# Patient Record
Sex: Female | Born: 1945 | Race: White | Hispanic: No | Marital: Married | Smoking: Never smoker
Health system: Southern US, Community
[De-identification: ages and names within clinical notes are randomized; demographics above are authoritative.]

## PROBLEM LIST (undated history)

## (undated) DIAGNOSIS — I10 Essential (primary) hypertension: Secondary | ICD-10-CM

## (undated) DIAGNOSIS — M2010 Hallux valgus (acquired), unspecified foot: Secondary | ICD-10-CM

## (undated) DIAGNOSIS — M2040 Other hammer toe(s) (acquired), unspecified foot: Secondary | ICD-10-CM

## (undated) DIAGNOSIS — L603 Nail dystrophy: Secondary | ICD-10-CM

## (undated) DIAGNOSIS — M1990 Unspecified osteoarthritis, unspecified site: Secondary | ICD-10-CM

## (undated) HISTORY — DX: Other hammer toe(s) (acquired), unspecified foot: M20.40

## (undated) HISTORY — DX: Hallux valgus (acquired), unspecified foot: M20.10

## (undated) HISTORY — DX: Essential (primary) hypertension: I10

## (undated) HISTORY — DX: Unspecified osteoarthritis, unspecified site: M19.90

## (undated) HISTORY — PX: COLONOSCOPY: SHX174

## (undated) HISTORY — DX: Nail dystrophy: L60.3

---

## 2005-01-17 ENCOUNTER — Encounter: Payer: Self-pay | Admitting: Internal Medicine

## 2008-09-08 HISTORY — PX: TOTAL HIP ARTHROPLASTY: SHX124

## 2009-02-26 ENCOUNTER — Inpatient Hospital Stay (HOSPITAL_COMMUNITY): Admission: RE | Admit: 2009-02-26 | Discharge: 2009-02-28 | Payer: Self-pay | Admitting: Orthopedic Surgery

## 2010-08-21 ENCOUNTER — Encounter (INDEPENDENT_AMBULATORY_CARE_PROVIDER_SITE_OTHER): Payer: Self-pay | Admitting: *Deleted

## 2010-09-08 HISTORY — PX: BUNIONECTOMY: SHX129

## 2010-09-17 ENCOUNTER — Encounter (INDEPENDENT_AMBULATORY_CARE_PROVIDER_SITE_OTHER): Payer: Self-pay | Admitting: *Deleted

## 2010-09-18 ENCOUNTER — Ambulatory Visit
Admission: RE | Admit: 2010-09-18 | Discharge: 2010-09-18 | Payer: Self-pay | Source: Home / Self Care | Attending: Internal Medicine | Admitting: Internal Medicine

## 2010-10-08 ENCOUNTER — Encounter: Payer: Self-pay | Admitting: Internal Medicine

## 2010-10-08 ENCOUNTER — Ambulatory Visit
Admission: RE | Admit: 2010-10-08 | Discharge: 2010-10-08 | Payer: Self-pay | Source: Home / Self Care | Attending: Internal Medicine | Admitting: Internal Medicine

## 2010-10-10 NOTE — Miscellaneous (Signed)
Summary: [facility] Previsit/prep  Clinical Lists Changes  Medications: Added new medication of MOVIPREP 100 GM  SOLR (PEG-KCL-NACL-NASULF-NA ASC-C) As per prep instructions. - Signed Rx of MOVIPREP 100 GM  SOLR (PEG-KCL-NACL-NASULF-NA ASC-C) As per prep instructions.;  #1 x 0;  Signed;  Entered by: Wyona Almas RN;  Authorized by: Iva Boop MD, [facility];  Method used: Electronically to [employer]. [place]. [phone]*, [address], [place], [place], [place]  [address], Ph: [phone] or [phone], Fax: [phone] Observations: Added new observation of NKA: T (09/18/2010 7:59)    Prescriptions: MOVIPREP 100 GM  SOLR (PEG-KCL-NACL-NASULF-NA ASC-C) As per prep instructions.  #1 x 0   Entered by:   Wyona Almas RN   Authorized by:   Iva Boop MD, [facility]   Signed by:   Wyona Almas RN on 09/18/2010   Method used:   Electronically to        [employer]. [address] [phone]* (retail)       [address]       [place], [place]  [address]       Ph: [phone] or [phone]       Fax: [phone]   RxID:   [id number]

## 2010-10-10 NOTE — Letter (Signed)
Summary: [facility]  [facility]   Imported By: Lester [place] 09/23/2010 08:50:11  _____________________________________________________________________  External Attachment:    Type:   Image     Comment:   External Document

## 2010-10-10 NOTE — Letter (Signed)
Summary: Pre Visit Letter Revised  [facility]  [address] [place], [place] [address]   Phone: [phone]  Fax: [phone]        08/21/2010 MRN: [id number] [facility] [address] [place], [place]  [address]             Procedure Date:  10/08/2010  Welcome to the Gastroenterology Division at [facility].    You are scheduled to see a nurse for your pre-procedure visit on 09/18/2010 at 8:00AM on the 3rd floor at [facility], [address]  We ask that you try to arrive at our office 15 minutes prior to your appointment time to allow for check-in.  Please take a minute to review the attached form.  If you answer "Yes" to one or more of the questions on the first page, we ask that you call the person listed at your earliest opportunity.  If you answer "No" to all of the questions, please complete the rest of the form and bring it to your appointment.    Your nurse visit will consist of discussing your medical and surgical history, your immediate family medical history, and your medications.   If you are unable to list all of your medications on the form, please bring the medication bottles to your appointment and we will list them.  We will need to be aware of both prescribed and over the counter drugs.  We will need to know exact dosage information as well.    Please be prepared to read and sign documents such as consent forms, a financial agreement, and acknowledgement forms.  If necessary, and with your consent, a friend or relative is welcome to sit-in on the nurse visit with you.  Please bring your insurance card so that we may make a copy of it.  If your insurance requires a referral to see a specialist, please bring your referral form from your primary care physician.  No co-pay is required for this nurse visit.     If you cannot keep your appointment, please call [phone] to cancel or reschedule prior to your appointment date.  This  allows [place] the opportunity to schedule an appointment for another patient in need of care.    Thank you for choosing [facility] for your medical needs.  We appreciate the opportunity to care for you.  Please visit [place] at our website  to learn more about our practice.  Sincerely, The Gastroenterology Division

## 2010-10-10 NOTE — Letter (Signed)
Summary: [facility] Instructions  [facility]  [address] [place], [place] [address]   Phone: [phone]  Fax: [phone]       Elizabeth Hale    [birth year]    MRN: [id number]        Procedure Day /Date:  Tuesday 10-08-10     Arrival Time:  9:00 am      Procedure Time:  10:00 am     Location of Procedure:                    _X_  [facility] (4th Floor)                        PREPARATION FOR COLONOSCOPY WITH MOVIPREP   Starting 5 days prior to your procedure _Thursday 1/26/12_ do not eat nuts, seeds, popcorn, corn, beans, peas,  salads, or any raw vegetables.  Do not take any fiber supplements (e.g. Metamucil, Citrucel, and Benefiber).  THE DAY BEFORE YOUR PROCEDURE         DATE: _1/30/12_  DAY: _Monday  _  1.  Drink clear liquids the entire day-NO SOLID FOOD  2.  Do not drink anything colored red or purple.  Avoid juices with pulp.  No orange juice.  3.  Drink at least 64 oz. (8 glasses) of fluid/clear liquids during the day to prevent dehydration and help the prep work efficiently.  CLEAR LIQUIDS INCLUDE: Water Jello Ice Popsicles Tea (sugar ok, no milk/cream) Powdered fruit flavored drinks Coffee (sugar ok, no milk/cream) Gatorade Juice: apple, white grape, white cranberry  Lemonade Clear bullion, consomm, broth Carbonated beverages (any kind) Strained chicken noodle soup Hard Candy                             4.  In the morning, mix first dose of MoviPrep solution:    Empty 1 Pouch A and 1 Pouch B into the disposable container    Add lukewarm drinking water to the top line of the container. Mix to dissolve    Refrigerate (mixed solution should be used within 24 hrs)  5.  Begin drinking the prep at 5:00 p.m. The MoviPrep container is divided by 4 marks.   Every 15 minutes drink the solution down to the next mark (approximately 8 oz) until the full liter is complete.   6.  Follow completed prep with 16 oz of clear liquid of  your choice (Nothing red or purple).  Continue to drink clear liquids until bedtime.  7.  Before going to bed, mix second dose of MoviPrep solution:    Empty 1 Pouch A and 1 Pouch B into the disposable container    Add lukewarm drinking water to the top line of the container. Mix to dissolve    Refrigerate  THE DAY OF YOUR PROCEDURE      DATE: _1/31/12  _ DAY: _Tuesday  _  Beginning at _5:00_a.m. (5 hours before procedure):         1. Every 15 minutes, drink the solution down to the next mark (approx 8 oz) until the full liter is complete.  2. Follow completed prep with 16 oz. of clear liquid of your choice.    3. You may drink clear liquids until _8:00 am_ (2 HOURS BEFORE PROCEDURE).   MEDICATION INSTRUCTIONS  Unless otherwise instructed, you should take regular prescription medications with a small sip of water  as early as possible the morning of your procedure.    Additional medication instructions: Hold HCTZ the morning of procedure.         OTHER INSTRUCTIONS  You will need a responsible adult at least 64 years of age to accompany you and drive you home.   This person must remain in the waiting room during your procedure.  Wear loose fitting clothing that is easily removed.  Leave jewelry and other valuables at home.  However, you may wish to bring a book to read or  an iPod/MP3 player to listen to music as you wait for your procedure to start.  Remove all body piercing jewelry and leave at home.  Total time from sign-in until discharge is approximately 2-3 hours.  You should go home directly after your procedure and rest.  You can resume normal activities the  day after your procedure.  The day of your procedure you should not:   Drive   Make legal decisions   Operate machinery   Drink alcohol   Return to work  You will receive specific instructions about eating, activities and medications before you leave.    The above instructions have been  reviewed and explained to me by   Wyona Almas RN  September 18, 2010 8:20 AM     I fully understand and can verbalize these instructions _____________________________ Date _________

## 2010-10-16 NOTE — Procedures (Signed)
Summary: Colonoscopy  Patient: [name] Note: All result statuses are Final unless otherwise noted.  Tests: (1) Colonoscopy (COL)   COL Colonoscopy           DONE     [facility]     [address]     [place], [place]  [address]           COLONOSCOPY PROCEDURE REPORT           PATIENT:  Elizabeth, Hale  MR#:  [id number]     BIRTHDATE:  1946/09/06, 64 yrs. old  GENDER:  female     ENDOSCOPIST:  Iva Boop, MD, [facility]     REF. BY:  Guerry Bruin, M.D.     PROCEDURE DATE:  10/08/2010     PROCEDURE:  Colonoscopy 19147     ASA CLASS:  Class II     INDICATIONS:  Routine Risk Screening     MEDICATIONS:   Fentanyl 50 mcg, Versed 5 mg           DESCRIPTION OF PROCEDURE:   After the risks benefits and     alternatives of the procedure were thoroughly explained, informed     consent was obtained.  Digital rectal exam was performed and     revealed no abnormalities.   The LB PCF-H180AL [id number] endoscope     was introduced through the anus and advanced to the cecum, which     was identified by both the appendix and ileocecal valve, without     limitations.  The quality of the prep was excellent, using     MoviPrep.  The instrument was then slowly withdrawn as the colon     was fully examined. Insertion: 6:54 minutes Withdrawal: 11:50     minutes     <<PROCEDUREIMAGES>>           FINDINGS:  A normal appearing cecum, ileocecal valve, and     appendiceal orifice were identified. The ascending, hepatic     flexure, transverse, splenic flexure, descending, sigmoid colon,     and rectum appeared unremarkable.   Retroflexed views in the right     colon and  rectum revealed no abnormalities.    The scope was then     withdrawn from the patient and the procedure completed.           COMPLICATIONS:  None     ENDOSCOPIC IMPRESSION:     1) Normal colonoscopy, good prep           REPEAT EXAM:  In 10 year(s) for routine screening colonoscopy.     Note: her mother had colon cancer  but it was diagnosed at age 73 or     38 so the patient is not at increased risk of colon cancer per     today's guidelines (due to advanced age of cancer diagnosis)     Iva Boop, MD, [facility]           CC:  Guerry Bruin, MD     The Patient           n.     Rosalie Doctor:   Iva Boop at 10/08/2010 11:08 AM           Malva Limes, [phone]  Note: An exclamation mark (!) indicates a result that was not dispersed into the flowsheet. Document Creation Date: 10/08/2010 11:09 AM _______________________________________________________________________  (1) Order result status: Final Collection or observation date-time: 10/08/2010 11:00 Requested date-time:  Receipt date-time:  Reported date-time:  Referring Physician:   Ordering Physician: Stan Head [phone]) Specimen Source:  Source: Launa Grill Order Number: [id number] Lab site:

## 2010-10-22 ENCOUNTER — Encounter (HOSPITAL_BASED_OUTPATIENT_CLINIC_OR_DEPARTMENT_OTHER)
Admission: RE | Admit: 2010-10-22 | Discharge: 2010-10-22 | Disposition: A | Discharge status: Home / Self Care | Payer: BC Managed Care – PPO | Source: Ambulatory Visit | Attending: Orthopedic Surgery | Admitting: Orthopedic Surgery

## 2010-10-22 DIAGNOSIS — Z01810 Encounter for preprocedural cardiovascular examination: Secondary | ICD-10-CM | POA: Insufficient documentation

## 2010-10-22 DIAGNOSIS — Z01812 Encounter for preprocedural laboratory examination: Secondary | ICD-10-CM | POA: Insufficient documentation

## 2010-10-22 LAB — BASIC METABOLIC PANEL
BUN: 16 mg/dL (ref 6–23)
Chloride: 102 mEq/L (ref 96–112)
Creatinine, Ser: 0.72 mg/dL (ref 0.4–1.2)
Glucose, Bld: 108 mg/dL — ABNORMAL HIGH (ref 70–99)
Potassium: 4.8 mEq/L (ref 3.5–5.1)

## 2010-10-23 ENCOUNTER — Ambulatory Visit (HOSPITAL_BASED_OUTPATIENT_CLINIC_OR_DEPARTMENT_OTHER)
Admission: RE | Admit: 2010-10-23 | Discharge: 2010-10-23 | Disposition: A | Discharge status: Home / Self Care | Payer: BC Managed Care – PPO | Attending: Orthopedic Surgery | Admitting: Orthopedic Surgery

## 2010-10-23 DIAGNOSIS — Z01812 Encounter for preprocedural laboratory examination: Secondary | ICD-10-CM | POA: Insufficient documentation

## 2010-10-23 DIAGNOSIS — G5600 Carpal tunnel syndrome, unspecified upper limb: Secondary | ICD-10-CM | POA: Insufficient documentation

## 2010-10-28 NOTE — Op Note (Signed)
  NAMELAURALYE, KINN                ACCOUNT NO.:  [id number]  MEDICAL RECORD NO.:  [id number]           PATIENT TYPE:  LOCATION:                                 FACILITY:  PHYSICIAN:  Cindee Salt, M.D.       DATE OF BIRTH:  01-Sep-1946  DATE OF PROCEDURE:  10/23/2010 DATE OF DISCHARGE:                              OPERATIVE REPORT   PREOPERATIVE DIAGNOSIS:  Carpal tunnel syndrome, right hand.  POSTOPERATIVE DIAGNOSIS:  Carpal tunnel syndrome, right hand.  OPERATION:  Decompression of right median nerve.  SURGEON:  Cindee Salt, MD  ANESTHESIA:  Forearm-based IV regional with local infiltration.  ANESTHESIOLOGIST:  Janetta Hora. Gelene Mink, MD.  HISTORY:  The patient is a 65 year old female with a history of carpal tunnel syndrome, EMG nerve conductions positive.  This has not responded to conservative treatment.  She has elected to undergo surgical decompression.  Pre, peri, and postoperative course have been discussed along with risks and complications.  She is aware that there is no guarantee with the surgery, possibility of infection, recurrence, injury to arteries, nerves, tendons, incomplete relief of symptoms, dystrophy. In preoperative area, the patient was seen, the extremity was marked by both the patient and surgeon, antibiotic given.  PROCEDURE:  The patient was brought to the operating room where a forearm based IV regional anesthetic was carried out without difficulty. She was prepped using ChloraPrep in supine position, the right arm free. A 3-minute dry time was allowed.  Time-out taken confirming the patient and procedure.  A longitudinal incision was made in the palm, carried down through subcutaneous tissue.  Bleeders were electrocauterized, palmar fascia was split, superficial palmar arch identified, the flexor tendon to the ring and little fingers identified to the ulnar side of median nerve.  The carpal retinaculum was incised with sharp dissection. Right  angle and Sewall retractors were placed between skin and forearm fascia.  The fascia released for approximately a centimeter and half proximal to the wrist crease under direct vision.  The canal was explored.  The area of compression to the nerve was apparent.  No further lesions were identified.  Tenosynovium tissue was moderately thickened.  The wound was copiously irrigated with saline.  The skin was closed with interrupted 5-0 Vicryl Rapide sutures.  A local infiltration with 0.25% Marcaine without epinephrine was given, approximately 4.5 mL was used.  A sterile compressive dressing and splint to the wrist with fingers free was applied.  On deflation of the tourniquet, all fingers immediately pinked.  She was taken to the recovery room for observation in satisfactory condition.  She will be discharged home to return to the [facility] in 1 week on Vicodin.          ______________________________ Cindee Salt, M.D.     GK/MEDQ  D:  10/23/2010  T:  10/23/2010  Job:  [id number]  cc:   Gaspar Garbe, M.D.  Electronically Signed by Cindee Salt M.D. on 10/28/2010 02:26:20 PM

## 2010-12-16 LAB — BASIC METABOLIC PANEL
BUN: 19 mg/dL (ref 6–23)
BUN: 6 mg/dL (ref 6–23)
CO2: 28 mEq/L (ref 19–32)
Calcium: 8.3 mg/dL — ABNORMAL LOW (ref 8.4–10.5)
Calcium: 8.5 mg/dL (ref 8.4–10.5)
Chloride: 101 mEq/L (ref 96–112)
Chloride: 103 mEq/L (ref 96–112)
Creatinine, Ser: 0.7 mg/dL (ref 0.4–1.2)
Creatinine, Ser: 0.75 mg/dL (ref 0.4–1.2)
Creatinine, Ser: 0.97 mg/dL (ref 0.4–1.2)
GFR calc Af Amer: >60 mL/min (ref >60)
GFR calc non Af Amer: 58 mL/min — ABNORMAL LOW (ref >60)
GFR calc non Af Amer: >60 mL/min (ref >60)
Glucose, Bld: 113 mg/dL — ABNORMAL HIGH (ref 70–99)
Glucose, Bld: 96 mg/dL (ref 70–99)
Potassium: 3.3 mEq/L — ABNORMAL LOW (ref 3.5–5.1)
Potassium: 3.9 mEq/L (ref 3.5–5.1)
Sodium: 137 mEq/L (ref 135–145)

## 2010-12-16 LAB — DIFFERENTIAL
Basophils Relative: 1 % (ref 0–1)
Eosinophils Absolute: 0.1 10*3/uL (ref 0.0–0.7)
Eosinophils Relative: 2 % (ref 0–5)
Lymphs Abs: 1.4 10*3/uL (ref 0.7–4.0)
Neutrophils Relative %: 64 % (ref 43–77)

## 2010-12-16 LAB — CBC
HCT: 32.2 % — ABNORMAL LOW (ref 36.0–46.0)
HCT: 41.3 % (ref 36.0–46.0)
Hemoglobin: 11.1 g/dL — ABNORMAL LOW (ref 12.0–15.0)
MCHC: 34.2 g/dL (ref 30.0–36.0)
MCHC: 34.3 g/dL (ref 30.0–36.0)
MCV: 92.8 fL (ref 78.0–100.0)
MCV: 93.7 fL (ref 78.0–100.0)
Platelets: 160 10*3/uL (ref 150–400)
Platelets: 164 10*3/uL (ref 150–400)
Platelets: 209 10*3/uL (ref 150–400)
RDW: 13.1 % (ref 11.5–15.5)
RDW: 13.4 % (ref 11.5–15.5)
RDW: 13.6 % (ref 11.5–15.5)
WBC: 8.3 10*3/uL (ref 4.0–10.5)

## 2010-12-16 LAB — URINALYSIS, ROUTINE W REFLEX MICROSCOPIC
Bilirubin Urine: NEGATIVE
Glucose, UA: NEGATIVE mg/dL
Hgb urine dipstick: NEGATIVE
Protein, ur: NEGATIVE mg/dL

## 2010-12-16 LAB — TYPE AND SCREEN
ABO/RH(D): A POS
Antibody Screen: NEGATIVE

## 2010-12-16 LAB — PROTIME-INR: Prothrombin Time: 13.5 seconds (ref 11.6–15.2)

## 2010-12-16 LAB — ABO/RH: ABO/RH(D): A POS

## 2011-01-21 NOTE — H&P (Signed)
NAMELADA, FULBRIGHT                ACCOUNT NO.:  [id number]   MEDICAL RECORD NO.:  [id number]          PATIENT TYPE:  INP   LOCATION:  NA                           FACILITY[facility]  [facility]   PHYSICIAN:  Madlyn Frankel. Charlann Boxer, M.D.  DATE OF BIRTH:  1946/07/14   DATE OF ADMISSION:  DATE OF DISCHARGE:                              HISTORY & PHYSICAL   PRIMARY CARE PHYSICIAN:  Gaspar Garbe, M.D.   ADMISSION DIAGNOSIS:  Right hip osteoarthritis.   SECONDARY DIAGNOSIS:  Borderline hypertension.   BRIEF HISTORY:  Elizabeth Hale is a very pleasant 65 year old female who  presented to the office for evaluation of right hip pain recently.  Radiographs revealed advanced right hip osteoarthritis.  She had failed  conservative measures and already wished to proceed with the hip  replacement surgery after reviewing the risks and benefits.  She is here  for history and physical.   PAST MEDICAL HISTORY:  Borderline hypertension.   PAST SURGICAL HISTORY:  Laparoscopy surgery as well as D&C in the past.   SOCIAL HISTORY:  She denies smoking and alcohol.  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  Her primary care physician Dr. Guerry Bruin,  She  enjoys some wine occasionally.   CURRENT MEDICATIONS:  Include hydrochlorothiazide and Relafen without  significant relief of her hip pain.   DRUG ALLERGIES:  No known drug allergies.   REVIEW OF SYSTEMS:  Otherwise unremarkable for any upper respiratory,  pulmonary, gastrointestinal or genitourinary issues over the past 2 to 3  weeks.   EXAMINATION:  GENERAL:  She is awake, alert and oriented, in no acute  distress.  She is 5 feet 7 inches, 170 pounds.  VITAL SIGNS:  At the time of evaluation, she had a pulse of 56,  respirations 18 and blood pressure 122/78.  HEAD AND NECK:  Exam is within normal limits without any ocular  dysfunction or slurred speech.  CHEST:  Exam reveals that she is clear to auscultation.  HEART:  Regular rate and rhythm.  ABDOMEN: Soft  and nontender.  EXAMINATION of EXTREMITIES:  Revealed a Trendelenburg gait of the right  hip with limited hip range of motion on the right with normal hip range  of motion on the left.  NEUROLOGIC:  She is neurovascularly stable distally without significant  leg length discrepancy.   Labs and EKG will be pending her evaluation through the [facility]  system.  She has been seen and evaluated by Dr. Wylene Simmer and  cleared for  surgery.   Radiographs of her hip were reviewed with her indicating advanced right  hip osteoarthritis with relative preservation of the left hip.   ASSESSMENT:  Advanced right hip osteoarthritis.   PLAN:  She is to be admitted for same-day surgery on February 26, 2009 for  her right total hip replacement.  Risks and benefits of the procedure  were discussed at her primary evaluation and reviewed again today.   She will hopefully plan to return to home postoperatively with the use  of home physical therapy progressing to independent activity over the  next 3 to 4 weeks.  Questions encouraged, answered, and reviewed.  Consent will be obtained based upon our discussion today.      Madlyn Frankel Charlann Boxer, M.D.  Electronically Signed     MDO/MEDQ  D:  02/18/2009  T:  02/18/2009  Job:  [id number]

## 2011-01-21 NOTE — Op Note (Signed)
Elizabeth Hale, Elizabeth Hale                ACCOUNT NO.:  [id number]   MEDICAL RECORD NO.:  [id number]          PATIENT TYPE:  INP   LOCATION:  0004                         FACILITY:  [facility]   PHYSICIAN:  Madlyn Frankel. Charlann Boxer, M.D.  DATE OF BIRTH:  09/24/1945   DATE OF PROCEDURE:  02/26/2009  DATE OF DISCHARGE:                               OPERATIVE REPORT   PREOPERATIVE DIAGNOSIS:  Right hip osteoarthritis.   POSTOPERATIVE DIAGNOSIS:  Right hip osteoarthritis.   PROCEDURE:  Right total hip replacement.   COMPONENTS USED:  DePuy hip system size 52 Pinnacle cup, a 36 metal-on-  metal liner with a 5 high Trilock stem and a 36, +5 ball utilized.   SURGEON:  Madlyn Frankel. Charlann Boxer, M.D.   ASSISTANT:  Yetta Glassman. Mann, PA.   ANESTHESIA:  Spinal block anesthesia.   FINDINGS:  None.   SPECIMENS:  None.   COMPLICATIONS:  None.   DRAINS:  Times one.   ESTIMATED BLOOD LOSS:  300 mL.   INDICATIONS FOR PROCEDURE:  Ms. Gassner is a very pleasant 65 year old  female.  She presented to the office for evaluation of right hip pain.  Radiographs revealed bone-on-bone arthritis.  There was very little  conservative measures that would provide any longterm relief and I  reviewed this with her.  The hip replacement surgery was discussed in  terms of the risks and benefits and the pros and cons.  Consent was  obtained for the benefit of pain relief.   PROCEDURE IN DETAIL:  The patient was brought to the operative theater.  Once adequate anesthesia, preoperative antibiotics, Ancef administered  the patient was positioned in the left lateral decubitus position with  right-side up.  We then pre-scrubbed, prepped and draped the right hip.  A time-out was performed identifying the patient, planned procedure and  the extremity.   A lateral based incision was made for a posterior approach to the hip.  The iliotibial band and gluteal fascia were then dissected out and then  incised posteriorly.  The sharp dissection  was carried down to the short  external rotators.  These were taken down and separated from the  posterior capsule.  An L capsulotomy was made preserving the posterior  leaflet for protection and the sciatic nerve, but also repaired and the  case.   The hip was dislocated and significant pathology identified.  I made a  neck osteotomy utilizing a broach.  The final set broach with 28 ball in  the center head for landmark identification.  Following the neck  osteotomy, attention was directed to the femoral preparation.  I used a  box osteotome to assure that I was out lateral within the neck.  We used  a starting drill and hand reamed the acetabulum and femoral canal once  and then irrigated to prevent fat emboli.  I began broaching with a size  1 broach setting within her native femur which was about 20-25 degrees.  I broached all way up to a size 5 which had excellent medial and lateral  fit with the proximal femur.   Attention was  now directed to the acetabulum.  The Acetabular retractors  were placed.  I debrided the labrum and began reaming with a 45 reamer.  I then went to 47 and 49 and 51 reamers with good bony bed preparation.  The canal was fine.  The acetabulum was finally prepared with irrigation  and a size 52 Pinnacle cup was impacted.   Due to the size of her thigh I ended up using a bone tamp on the  anterior aspect of the cup in order to assure that I had enough  anteversion.  There was a portion the cup exposed in the superior  lateral.  The final impaction was carried out and it was well seated  within the acetabular bed.  I placed the two cancellus bone screws  within the ilium and a trial liner.   The trial reduction was now carried out with a 5 broach in place, a 5  high neck and a 36,  +1.5 ball.   The combined anteversion seemed to be about 45-50 degrees.  There was 7  mm of shuck in the leg lengths just a little shorter than the  preoperative position.  I  went ahead and trialed a 36+ ball.  The  approximation was excellent without evidence of impingement or  subluxation.  The leg lengths appeared to be better with only a mm or  less of shuck.   Given these parameters, the trial components were removed.  I placed the  final 36 metal liner and impacted it with good visualized rim fit.   The final 5 high Trilock stem was then opened and impacted at the level  of the broach.  I did go ahead and retrial just to make sure that I was  happy with the leg lengths and stability and once I had confirmed this a  36, +5 ball was impacted onto clean and dry trunnion.  The hip was  reduced.  We irrigated the hip throughout the case and again at this  point.  I reapproximated the posterior capsule and superior leaflet  using #1 Vicryl.  A medium Hemovac drain was placed deep.  The remainder  of the wound was closed in layers with #1 Vicryl in the iliotibial band  and the gluteal fascia.  The remainder of the wound was closed with 2-0  Vicryl and running 4-0 Monocryl.  The hip was cleaned, dried and dressed  sterilely with Steri-Strips and a Mepilex dressing.  She was then  brought to the recovery room in stable condition, tolerating the  procedure well.      Madlyn Frankel Charlann Boxer, M.D.  Electronically Signed     MDO/MEDQ  D:  02/26/2009  T:  02/26/2009  Job:  [id number]

## 2011-01-24 NOTE — Discharge Summary (Signed)
Elizabeth Hale, Elizabeth Hale                ACCOUNT NO.:  [id number]   MEDICAL RECORD NO.:  [id number]          PATIENT TYPE:  INP   LOCATION:  1616                         FACILITY:  [facility]   PHYSICIAN:  Madlyn Frankel. Charlann Boxer, M.D.  DATE OF BIRTH:  1946/05/30   DATE OF ADMISSION:  02/26/2009  DATE OF DISCHARGE:  02/28/2009                               DISCHARGE SUMMARY   ADMISSION DIAGNOSES:  1. Osteoarthritis.  2. Borderline hypertension.   DISCHARGE DIAGNOSES:  1. Osteoarthritis.  2. Borderline hypertension.   HISTORY OF PRESENT ILLNESS:  Elizabeth Hale is a very pleasant 65 year old  female with history of right hip pain secondary to osteoarthritis.  She  has failed all conservative measures.   CONSULTATION:  None.   PROCEDURE:  Right total hip replacement by surgeon Dr. Durene Romans,  assistant Dwyane Luo PA-C.   LABORATORY DATA:  CBC final reading showed a white blood cell 8.3,  hemoglobin 10.3, hematocrit 29.8, platelets 164.  Metabolic:  Sodium  137, potassium 3.3, BUN 6, creatinine 0.75, glucose 113.  Calcium 8.5.  UA was negative.   HOSPITAL COURSE:  The patient underwent a right total hip replacement  and was admitted to orthopedic floor.  Her stay was unremarkable.  She  remained hemodynamically orthopedically stable.  Dressing was changed on  day two, no significant drainage from wound.  She remained  neurovascularly intact in the lower extremity.  Throughout her stay she  had improving quad function and ability to ambulate prior to discharge.  Seen on day 2, stable and ready for discharge home.   DISCHARGE DISPOSITION:  Discharged home in stable and improved condition  with home health care PT.   DISCHARGE DIET:  Heart-healthy.   DISCHARGE WOUND CARE:  Keep dry.   DISCHARGE MEDICATIONS:  1. Aspirin 325 mg 1 p.o. b.i.d. times 6 weeks.  2. Robaxin 500 mg 1 p.o. q. 6.  3. Iron 325 mg 1 p.o. t.i.d.  4. Colace 100 mg p.o. b.i.d.  5. MiraLax 17 grams p.o. daily.  6. Norco  7.5/325 one to two p.o. every 4 to 6 hours p.r.n. pain.  7. Zofran ODT 4 mg sublingual q. 6 p.r.n. nausea.  8. HCTZ 25 mg 1  p.o. daily.]  9. Clindamycin topical b.i.d.   DISCHARGE FOLLOWUP:  Follow with Dr. Charlann Boxer at phone number [phone] in 2  weeks for wound check.     ______________________________  Yetta Glassman. [address], [place]      Madlyn Frankel. Charlann Boxer, M.D.  Electronically Signed    BLM/MEDQ  D:  03/13/2009  T:  03/13/2009  Job:  [id number]   cc:   Gaspar Garbe, M.D.  Fax: [phone]

## 2014-06-16 ENCOUNTER — Encounter (HOSPITAL_BASED_OUTPATIENT_CLINIC_OR_DEPARTMENT_OTHER): Payer: Self-pay

## 2014-06-16 ENCOUNTER — Ambulatory Visit (HOSPITAL_BASED_OUTPATIENT_CLINIC_OR_DEPARTMENT_OTHER): Admit: 2014-06-16 | Payer: BC Managed Care – PPO | Admitting: Podiatry

## 2014-06-16 SURGERY — OSTEOTOMY, METATARSAL BONE
Anesthesia: General | Site: Toe | Laterality: Right

## 2014-10-24 ENCOUNTER — Ambulatory Visit: Payer: Self-pay | Admitting: Podiatry

## 2014-10-31 ENCOUNTER — Encounter: Payer: Self-pay | Admitting: Podiatry

## 2014-10-31 ENCOUNTER — Ambulatory Visit (INDEPENDENT_AMBULATORY_CARE_PROVIDER_SITE_OTHER): Payer: BC Managed Care – PPO | Admitting: Podiatry

## 2014-10-31 ENCOUNTER — Ambulatory Visit (INDEPENDENT_AMBULATORY_CARE_PROVIDER_SITE_OTHER): Payer: BC Managed Care – PPO

## 2014-10-31 VITALS — BP 126/67 | HR 72 | Resp 16 | Ht 67.0 in | Wt 178.0 lb

## 2014-10-31 DIAGNOSIS — M2011 Hallux valgus (acquired), right foot: Secondary | ICD-10-CM

## 2014-10-31 DIAGNOSIS — L608 Other nail disorders: Secondary | ICD-10-CM

## 2014-10-31 DIAGNOSIS — L603 Nail dystrophy: Secondary | ICD-10-CM

## 2014-10-31 DIAGNOSIS — M205X1 Other deformities of toe(s) (acquired), right foot: Secondary | ICD-10-CM | POA: Diagnosis not present

## 2014-10-31 DIAGNOSIS — M2041 Other hammer toe(s) (acquired), right foot: Secondary | ICD-10-CM

## 2014-10-31 DIAGNOSIS — M1990 Unspecified osteoarthritis, unspecified site: Secondary | ICD-10-CM

## 2014-10-31 NOTE — Patient Instructions (Addendum)
Pre-Operative Instructions  Congratulations, you have decided to take an important step to improving your quality of life.  You can be assured that the doctors of [facility] will be with you every step of the way.  1. Plan to be at the surgery center/hospital at least 1 (one) hour prior to your scheduled time unless otherwise directed by the surgical center/hospital staff.  You must have a responsible adult accompany you, remain during the surgery and drive you home.  Make sure you have directions to the surgical center/hospital and know how to get there on time. 2. For hospital based surgery you will need to obtain a history and physical form from your family physician within 1 month prior to the date of surgery- we will give you a form for you primary physician.  3. We make every effort to accommodate the date you request for surgery.  There are however, times where surgery dates or times have to be moved.  We will contact you as soon as possible if a change in schedule is required.   4. No Aspirin/Ibuprofen for one week before surgery.  If you are on aspirin, any non-steroidal anti-inflammatory medications (Mobic, Aleve, Ibuprofen) you should stop taking it 7 days prior to your surgery.  You make take Tylenol  For pain prior to surgery.  5. Medications- If you are taking daily heart and blood pressure medications, seizure, reflux, allergy, asthma, anxiety, pain or diabetes medications, make sure the surgery center/hospital is aware before the day of surgery so they may notify you which medications to take or avoid the day of surgery. 6. No food or drink after midnight the night before surgery unless directed otherwise by surgical center/hospital staff. 7. No alcoholic beverages 24 hours prior to surgery.  No smoking 24 hours prior to or 24 hours after surgery. 8. Wear loose pants or shorts- loose enough to fit over bandages, boots, and casts. 9. No slip on shoes, sneakers are best. 10. Bring  your boot with you to the surgery center/hospital.  Also bring crutches or a walker if your physician has prescribed it for you.  If you do not have this equipment, it will be provided for you after surgery. 11. If you have not been contracted by the surgery center/hospital by the day before your surgery, call to confirm the date and time of your surgery. 12. Leave-time from work may vary depending on the type of surgery you have.  Appropriate arrangements should be made prior to surgery with your employer. 13. Prescriptions will be provided immediately following surgery by your doctor.  Have these filled as soon as possible after surgery and take the medication as directed. 14. Remove nail polish on the operative foot. 15. Wash the night before surgery.  The night before surgery wash the foot and leg well with the antibacterial soap provided and water paying special attention to beneath the toenails and in between the toes.  Rinse thoroughly with water and dry well with a towel.  Perform this wash unless told not to do so by your physician.  Enclosed: 1 Ice pack (please put in freezer the night before surgery)   1 Hibiclens skin cleaner   Pre-op Instructions  If you have any questions regarding the instructions, do not hesitate to call our office.  [place]: [address] [place], [place] [address] [phone]  [place]: [address] [place], [place] [address] [phone]  [place]: [address]  [place], [place] [address] [phone]  Dr. Richard   Tuchman DPM, Dr. Ila Mcgill DPM Dr. Harriet Masson DPM, Dr. Lanelle Bal DPM, Dr. Trudie Buckler DPM   Bunion (Hallux Valgus) A bony bump (protrusion) on the inside of the foot, at the base of the first toe, is called a bunion (hallux valgus). A bunion causes the first toe to angle toward the other toes. SYMPTOMS  16. A bony bump on the inside of the foot, causing an outward turning of the first toe. It may also overlap the second  toe. 17. Thickening of the skin (callus) over the bony bump. 18. Fluid buildup under the callus. Fluid may become red, tender, and swollen (inflamed) with constant irritation or pressure. 19. Foot pain and stiffness. CAUSES  Many causes exist, including:  Inherited from your family (genetics).  Injury (trauma) forcing the first toe into a position in which it overlaps other toes.  Bunions are also associated with wearing shoes that have a narrow toe box (pointy shoes). RISK INCREASES WITH:  Family history of foot abnormalities, especially bunions.  Arthritis.  Narrow shoes, especially high heels. PREVENTION  Wear shoes with a wide toe box.  Avoid shoes with high heels.  Wear a small pad between the big toe and second toe.  Maintain proper conditioning:  Foot and ankle flexibility.  Muscle strength and endurance. PROGNOSIS  With proper treatment, bunions can typically be cured. Occasionally, surgery is required.  RELATED COMPLICATIONS   Infection of the bunion.  Arthritis of the first toe.  Risks of surgery, including infection, bleeding, injury to nerves (numb toe), recurrent bunion, overcorrection (toe points inward), arthritis of the big toe, big toe pointing upward, and bone not healing. TREATMENT  Treatment first consists of stopping the activities that aggravate the pain, taking pain medicines, and icing to reduce inflammation and pain. Wear shoes with a wide toe box. Shoes can be modified by a shoe repair person to relieve pressure on the bunion, especially if you cannot find shoes with a wide enough toe box. You may also place a pad with the center cut out in your shoe, to reduce pressure on the bunion. Sometimes, an arch support (orthotic) may reduce pressure on the bunion and alleviate the symptoms. Stretching and strengthening exercises for the muscles of the foot may be useful. You may choose to wear a brace or pad at night to hold the big toe away from the second  toe. If non-surgical treatments are not successful, surgery may be needed. Surgery involves removing the overgrown tissue and correcting the position of the first toe, by realigning the bones. Bunion surgery is typically performed on an outpatient basis, meaning you can go home the same day as surgery. The surgery may involve cutting the mid portion of the bone of the first toe, or just cutting and repairing (reconstructing) the ligaments and soft tissues around the first toe.  MEDICATION   If pain medicine is needed, nonsteroidal anti-inflammatory medicines, such as aspirin and ibuprofen, or other minor pain relievers, such as acetaminophen, are often recommended.  Do not take pain medicine for 7 days before surgery.  Prescription pain relievers are usually only prescribed after surgery. Use only as directed and only as much as you need.  Ointments applied to the skin may be helpful. HEAT AND COLD  Cold treatment (icing) relieves pain and reduces inflammation. Cold treatment should be applied for 10 to 15 minutes every 2 to 3 hours for inflammation and pain and immediately after any activity that aggravates your symptoms. Use ice  packs or an ice massage.  Heat treatment may be used prior to performing the stretching and strengthening activities prescribed by your caregiver, physical therapist, or athletic trainer. Use a heat pack or a warm soak. SEEK MEDICAL CARE IF:   Symptoms get worse or do not improve in 2 weeks, despite treatment.  After surgery, you develop fever, increasing pain, redness, swelling, drainage of fluids, bleeding, or increasing warmth around the surgical area.  New, unexplained symptoms develop. (Drugs used in treatment may produce side effects.) Document Released: 08/25/2005 Document Revised: 11/17/2011 Document Reviewed: 12/07/2008 [facility] Patient Information 2015 [place], [place]. This information is not intended to replace advice given to you by your health care  provider. Make sure you discuss any questions you have with your health care provider.    Hammer Toes Hammer toes is a condition in which one or more of your toes is permanently flexed. CAUSES  This happens when a muscle imbalance or abnormal bone length makes your small toes buckle. This causes the toe joint to contract and the strong cord-like bands that attach muscles to the bones (tendons) in your toes to shorten.  SIGNS AND SYMPTOMS  Common symptoms of flexible hammer toes include:  20. A buildup of skin cells (corns). Corns occur where boney bumps come in frequent contact with hard surfaces. For example, where your shoes press and rub. 21. Irritation. 22. Inflammation. 23. Pain. 24. Limited motion in your toes. DIAGNOSIS  Hammer toes are diagnosed through a physical exam of your toes. During the exam, your health care provider may try to reproduce your symptoms by manipulating your foot. Often, X-ray exams are done to determine the degree of deformity and to make sure that the cause is not a fracture.  TREATMENT  Hammer toes can be treated with corrective surgery. There are several types of surgical procedures that can treat hammer toes. The most common procedures include:  Arthroplasty--A portion of the joint is surgically removed and your toe is straightened. The gap fills in with fibrous tissue. This procedure helps treat pain and deformity and helps restore function.  Fusion--Cartilage between the two bones of the affected joint is taken out and the bones fuse together into one longer bone. This helps keep your toe stable and reduces pain but leaves your toe stiff, yet straight.  Implantation--A portion of your bone is removed and replaced with an implant to restore motion.  Flexor tendon transfers--This procedure repositions the tendons that curl the toes down (flexor tendons). This may be done to release the deforming force that causes your toe to buckle. Several of these  procedures require fixing your toe with a pin that is visible at the tip of your toe. The pin keeps the toe straight during healing. Your health care provider will remove the pin usually within 4-8 weeks after the procedure.  Document Released: 08/22/2000 Document Revised: 08/30/2013 Document Reviewed: 05/02/2013 [facility] Patient Information 2015 [place], [place]. This information is not intended to replace advice given to you by your health care provider. Make sure you discuss any questions you have with your health care provider.

## 2014-10-31 NOTE — Progress Notes (Signed)
   Subjective:    Patient ID: Elizabeth Hale, female    DOB: 12/04/1945, 69 y.o.   MRN: [id number]  HPI Comments: "I have some problems with the right foot"  Patient c/o aching 1st and 5th MPJ right and also 2nd toe right for several years. The areas get swollen and red. Shoes are uncomfortable at times. Feet hurt based on types of activity. She wants to discuss surgery. Dr. Fritzi Mandes did her left foot surgery.   She is also concerned about the toenails being discolored and thick. She has trouble trimming them as well.  Foot Pain Associated symptoms include arthralgias.  Toe Pain       Review of Systems  Musculoskeletal: Positive for arthralgias.  Allergic/Immunologic: Positive for environmental allergies.  All other systems reviewed and are negative.      Objective:   Physical Exam: I have reviewed her past medical history medications allergies surgery social history review of systems. Pulses are strongly palpable bilateral. Neurologic sensorium is intact per Derrel Nip monofilament. Deep tendon reflexes are intact bilateral and muscle strength is 5 over 5 dorsiflexors plantar flexors and inverters and everters all intrinsic musculature is intact. Orthopedic evaluation demonstrates all joints distal to the ankle before range of motion without crepitation with exception of the first metatarsophalangeal joint which is extremely limited on dorsiflexion and does demonstrate crepitation and pain. 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 Left foot is in good rectus position due to previous bunion repair and fifth metatarsal osteotomy visible on radiographs. Radiographs do demonstrate repair of the left foot which appears to be adequate. She also has severe osteoarthritis with joint space narrowing subchondral sclerosis and spurring first metatarsophalangeal joint right. She also has increase in the fourth intermetatarsal angle consistent with a tailor's bunion  deformity. Mild dorsiflexion of the second digit at the metatarsophalangeal joint. Cutaneous evaluation is supple well-hydrated cutis her nails are thick yellow dystrophic possibly mycotic bilateral.        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 Dystrophic nails bilateral.  Plan: We discussed the etiology pathology conservative versus surgical therapies. At this point we discussed surgical correction of the first metatarsophalangeal joint which will include an arthroplasty with a single silicone implant first metatarsophalangeal joint. Also a fifth metatarsal osteotomy with 2 screw fixation right foot. I answered all of the questions regarding these procedures to the best of my ability in layman's terms she understood this was amenable to it and signed all 3 pages of the consent form. We discussed the possible postop complications which may include but are not limited to postop pain bleeding swelling infection recurrence need for further surgery overcorrection and under correction failure to heal and the possibility of having to remove the implant. She understands this and is amenable to it. We also took samples of her nail plates and scan sent for pathologic evaluation I will follow-up with her in the near future for surgical intervention. I prefer not to start oral medication until we have completed surgery.

## 2014-11-08 ENCOUNTER — Other Ambulatory Visit: Payer: Self-pay | Admitting: Podiatry

## 2014-11-08 MED ORDER — OXYCODONE-ACETAMINOPHEN 10-325 MG PO TABS: ORAL_TABLET | ORAL | Status: DC

## 2014-11-08 MED ORDER — PROMETHAZINE HCL 25 MG PO TABS
25.0000 mg | ORAL_TABLET | Freq: Three times a day (TID) | ORAL | Status: DC | PRN
Start: 2014-11-08 — End: 2014-12-21

## 2014-11-08 MED ORDER — CEPHALEXIN 500 MG PO CAPS: 500.0000 mg | ORAL_CAPSULE | Freq: Three times a day (TID) | ORAL | Status: DC

## 2014-11-09 ENCOUNTER — Telehealth: Payer: Self-pay | Admitting: *Deleted

## 2014-11-09 NOTE — Telephone Encounter (Signed)
Pt states she is having surgery 11/10/2014 by Dr. Milinda Pointer, and has a long way to walk to her apartment from her car.  Pt asked for rx for walker.  I called pt with [facility] [phone], and encouraged her call to make certain the had a walker available.  I faxed the orders for a walker to [phone].

## 2014-11-10 ENCOUNTER — Encounter: Payer: Self-pay | Admitting: Podiatry

## 2014-11-10 DIAGNOSIS — M21541 Acquired clubfoot, right foot: Secondary | ICD-10-CM | POA: Diagnosis not present

## 2014-11-10 DIAGNOSIS — M2011 Hallux valgus (acquired), right foot: Secondary | ICD-10-CM | POA: Diagnosis not present

## 2014-11-10 HISTORY — PX: OTHER SURGICAL HISTORY: SHX169

## 2014-11-10 HISTORY — PX: METATARSAL OSTEOTOMY: SHX1641

## 2014-11-16 ENCOUNTER — Encounter: Payer: Self-pay | Admitting: Podiatry

## 2014-11-16 ENCOUNTER — Ambulatory Visit (INDEPENDENT_AMBULATORY_CARE_PROVIDER_SITE_OTHER): Payer: BC Managed Care – PPO

## 2014-11-16 ENCOUNTER — Ambulatory Visit (INDEPENDENT_AMBULATORY_CARE_PROVIDER_SITE_OTHER): Payer: BC Managed Care – PPO | Admitting: Podiatry

## 2014-11-16 VITALS — BP 96/62 | HR 87 | Resp 16

## 2014-11-16 DIAGNOSIS — Z9889 Other specified postprocedural states: Secondary | ICD-10-CM

## 2014-11-16 DIAGNOSIS — B353 Tinea pedis: Secondary | ICD-10-CM

## 2014-11-16 DIAGNOSIS — M205X1 Other deformities of toe(s) (acquired), right foot: Secondary | ICD-10-CM

## 2014-11-16 LAB — HEPATIC FUNCTION PANEL
ALBUMIN: 3.9 g/dL (ref 3.5–5.2)
ALT: 19 U/L (ref 0–35)
AST: 23 U/L (ref 0–37)
Alkaline Phosphatase: 88 U/L (ref 39–117)
BILIRUBIN TOTAL: 0.4 mg/dL (ref 0.2–1.2)
Bilirubin, Direct: 0.1 mg/dL (ref 0.0–0.3)
Indirect Bilirubin: 0.3 mg/dL (ref 0.2–1.2)
Total Protein: 6.2 g/dL (ref 6.0–8.3)

## 2014-11-16 MED ORDER — ITRACONAZOLE 200 MG PO TABS: 1.0000 | ORAL_TABLET | Freq: Every day | ORAL | Status: DC

## 2014-11-17 NOTE — Progress Notes (Signed)
Called patient - informed of normal labs and can continue meds.

## 2014-11-17 NOTE — Progress Notes (Signed)
She presents today 1 week status post Keller arthroplasty first metatarsophalangeal joint right foot and fifth metatarsal osteotomy with screw fixation right foot. She states that he seems to be doing good.  Objective: Vital signs are stable she is alert and oriented 3 Crestor dressing intact today once removed reveals no erythema edema saline strange or odor margins appear to be well coapted she has great range of motion of the first metatarsophalangeal joint as confirmed by radiograph good placement of the arthroplasty and screw fixation.  Assessment: Well-healed surgical foot right.  Plan: [place] today with a dry sterile compressive dressing will follow up with her in 1 week for suture removal.

## 2014-11-20 ENCOUNTER — Encounter: Payer: Self-pay | Admitting: Podiatry

## 2014-11-23 ENCOUNTER — Ambulatory Visit (INDEPENDENT_AMBULATORY_CARE_PROVIDER_SITE_OTHER): Payer: BC Managed Care – PPO | Admitting: Podiatry

## 2014-11-23 ENCOUNTER — Encounter: Payer: Self-pay | Admitting: Podiatry

## 2014-11-23 VITALS — BP 134/62 | HR 81 | Resp 14

## 2014-11-23 DIAGNOSIS — Z9889 Other specified postprocedural states: Secondary | ICD-10-CM | POA: Diagnosis not present

## 2014-11-23 NOTE — Progress Notes (Signed)
She presents today 2 weeks status post Austin bunion repair right and fifth metatarsal osteotomy right she denies fever chills nausea vomiting muscle aches and pains. She states that she is doing quite well. She states that she has been continuing her range of motion exercises.  Objective: Vital signs are stable she is alert and oriented 3. There is no erythema is mild edema no cellulitis drainage or odor. Margins. Be well coapted and sutures were removed. She has good range of motion of the first metatarsophalangeal joint of the right foot. Mild tenderness on palpation of the fifth metatarsal osteotomy right.  Assessment: Well-healing surgical foot right. Status post first and fifth metatarsal osteotomies.  Plan: I encouraged her to continue range of motion exercises and will allow her to get this wet provided she soaks it in Epsom salts and warm water afterwards. She's not to take a bath or shower is okay. 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 I also dispensed a compression anklet. We'll follow-up with her in 2 weeks. At which time Dr. Jacqualyn Posey may perform x-rays and may suggest a loose fitting shoe if applicable. She will then follow up with me in 2 weeks.

## 2014-11-29 NOTE — Progress Notes (Signed)
DOS 11/10/2014 Keller bunionectomy with single silicone implant 1st MPJ right and metatarsal osteotomy 5th MPJ with screw fixation right.

## 2014-12-08 ENCOUNTER — Ambulatory Visit (INDEPENDENT_AMBULATORY_CARE_PROVIDER_SITE_OTHER): Payer: BC Managed Care – PPO

## 2014-12-08 ENCOUNTER — Ambulatory Visit (INDEPENDENT_AMBULATORY_CARE_PROVIDER_SITE_OTHER): Payer: BC Managed Care – PPO | Admitting: Podiatry

## 2014-12-08 ENCOUNTER — Encounter: Payer: Self-pay | Admitting: Podiatry

## 2014-12-08 VITALS — BP 141/71 | HR 72 | Resp 12

## 2014-12-08 DIAGNOSIS — Z9889 Other specified postprocedural states: Secondary | ICD-10-CM

## 2014-12-08 NOTE — Progress Notes (Signed)
   Subjective:    Patient ID: KYRAH SCHIRO, female    DOB: [birth year], 69 y.o.   MRN: [id number]  HPI Comments: DOS 11/10/2014 Right austin bunionectomy, 5th metatarsal osteotomy.  69 year old female presents the office today status post right foot Austin bunionectomy and fifth metatarsal osteotomy. She states that overall she is doing well and she is ambulating and a surgical shoe without any difficulty. She denies any pain to the surgical site. She denies any swelling or redness. She denies any systemic complaints as fevers, chills, nausea, vomiting. No other complaints at this time in no acute changes since last appointment.    Review of Systems  All other systems reviewed and are negative.      Objective:   Physical Exam AAO 3, NAD DP/PT  Pulses palpable, CRT less than 3 seconds Protective sensation intact with Simms Weinstein monofilament Incisions are well coapted without any evidence of dehiscence. There is no tenderness to palpation over the surgical site at this time. There is no overlying edema, erythema, increase in warmth. Range of motion is intact both the fifth and first metatarsophalangeal joints. There is no areas of tenderness to bilateral lower extremities. MMT 5/5, ROM WNL No open lesions or pre-ulcer lesions identified bilaterally. No pain with calf compression, swelling, warmth, erythema.         Assessment & Plan:  69 year old female with well healing surgical foot on the right side status post first and fifth metatarsal osteotomies -X-rays were obtained and reviewed the patient. It is very increased consolidation across the site of the osteotomies. At this time as she has no pain she can start to transition back into regular supportive shoe gear as tolerated. If she has any increase in discomfort while wearing regular shoe she should return to the surgical shoe. -Ice and elevation -Pain medication as needed. -Follow-up in 2 weeks with Dr. Milinda Pointer or sooner if  any problems are to arise. In the meantime encouraged to call the office with any questions, concerns, change in symptoms.

## 2014-12-21 ENCOUNTER — Ambulatory Visit (INDEPENDENT_AMBULATORY_CARE_PROVIDER_SITE_OTHER): Payer: BC Managed Care – PPO

## 2014-12-21 ENCOUNTER — Encounter: Payer: Self-pay | Admitting: Podiatry

## 2014-12-21 ENCOUNTER — Ambulatory Visit (INDEPENDENT_AMBULATORY_CARE_PROVIDER_SITE_OTHER): Payer: BC Managed Care – PPO | Admitting: Podiatry

## 2014-12-21 VITALS — BP 141/74 | HR 66 | Resp 16

## 2014-12-21 DIAGNOSIS — M205X1 Other deformities of toe(s) (acquired), right foot: Secondary | ICD-10-CM

## 2014-12-21 DIAGNOSIS — Z9889 Other specified postprocedural states: Secondary | ICD-10-CM | POA: Diagnosis not present

## 2014-12-21 NOTE — Progress Notes (Signed)
She presents today 6 weeks status post Keller arthroplasty with silicone implant right fifth metatarsal osteotomy demonstrated fixation right. She states this seems to be getting better all the time. She continues to wear her regular tennis shoes.  Objective: Vital signs are stable she is alert and oriented 3. She denies fever chills nausea vomiting muscle aches and pains. Vital signs are stable she is alert and oriented 3. Pulses are palpable right foot. Wonderful range of motion of the first metatarsophalangeal joint right foot. Minimal edema no erythema saline drainage or odor incision sites are healing well. Radiographs confirmed good position and well healing osteotomies.  Assessment: Well-healing surgical foot right 6 weeks.  Plan: I would allow her some liberty to increase her activities I'll follow up with her in 1 month. We will also discuss her toenails at that time.

## 2015-01-25 ENCOUNTER — Encounter: Payer: Self-pay | Admitting: Podiatry

## 2015-01-25 ENCOUNTER — Ambulatory Visit (INDEPENDENT_AMBULATORY_CARE_PROVIDER_SITE_OTHER): Payer: BC Managed Care – PPO | Admitting: Podiatry

## 2015-01-25 ENCOUNTER — Ambulatory Visit (INDEPENDENT_AMBULATORY_CARE_PROVIDER_SITE_OTHER): Payer: BC Managed Care – PPO

## 2015-01-25 VITALS — BP 129/61 | HR 74 | Resp 16

## 2015-01-25 DIAGNOSIS — Z9889 Other specified postprocedural states: Secondary | ICD-10-CM

## 2015-01-25 DIAGNOSIS — L603 Nail dystrophy: Secondary | ICD-10-CM

## 2015-01-25 DIAGNOSIS — M205X1 Other deformities of toe(s) (acquired), right foot: Secondary | ICD-10-CM

## 2015-01-27 NOTE — Progress Notes (Signed)
She presents today status post Keller arthroplasty with single silicone implant right. This appears to be healing quite nicely she is doing well with this.  Objective: Vital signs are stable she is alert and oriented 3. Pulses are palpable. Good range of motion first metatarsophalangeal joint right. Minimal nail changes of yet secondary to therapy with intercondylar salt.  Assessment: Well healing surgical foot and onychomycosis.  Plan: Follow up with her in 2 months for evaluation of her onychomycosis and recheck on the bunion repair.

## 2015-03-29 ENCOUNTER — Ambulatory Visit (INDEPENDENT_AMBULATORY_CARE_PROVIDER_SITE_OTHER): Payer: BC Managed Care – PPO

## 2015-03-29 ENCOUNTER — Encounter: Payer: Self-pay | Admitting: Podiatry

## 2015-03-29 ENCOUNTER — Ambulatory Visit (INDEPENDENT_AMBULATORY_CARE_PROVIDER_SITE_OTHER): Payer: BC Managed Care – PPO | Admitting: Podiatry

## 2015-03-29 VITALS — BP 140/64 | HR 82 | Resp 16

## 2015-03-29 DIAGNOSIS — M205X1 Other deformities of toe(s) (acquired), right foot: Secondary | ICD-10-CM

## 2015-03-29 DIAGNOSIS — L603 Nail dystrophy: Secondary | ICD-10-CM | POA: Diagnosis not present

## 2015-03-29 DIAGNOSIS — B351 Tinea unguium: Secondary | ICD-10-CM | POA: Diagnosis not present

## 2015-03-29 DIAGNOSIS — Z9889 Other specified postprocedural states: Secondary | ICD-10-CM | POA: Diagnosis not present

## 2015-03-29 DIAGNOSIS — M79673 Pain in unspecified foot: Secondary | ICD-10-CM | POA: Diagnosis not present

## 2015-03-29 NOTE — Progress Notes (Signed)
She presents today for her final postop visit regarding Keller arthroplasty single silicone implant and fifth metatarsal osteotomy right. She also follows up for her onychomycosis and the use of itraconazole. She states that the toenails have not changed at all. However her first metatarsophalangeal joint and fifth metatarsal osteotomies have gone heal quite nicely she's as I have very little pain to the foot whatsoever. She denies any changes in her past medical history medications allergies surgeries or social history.  Objective: Vital signs are stable alert and oriented 3. Pulses are strongly palpable bilateral. She has great range of motion with no erythema edema saline as drainage or odor first metatarsophalangeal joint of the right foot and fifth metatarsal. Radiographs confirm well-healing surgical foot right. Onychomycosis is unchanged. I reevaluated her lab report today which did demonstrate nail dystrophy as well more than likely this is the result and there will be minimal change with oral or any other type of medication regarding fungus.  Assessment: Well-healing surgical foot right. Status post Keller arthroplasty fifth metatarsal osteotomy right and onychomycosis.  Plan: Follow-up with be on an as-needed basis return to normal activities.

## 2015-04-30 ENCOUNTER — Encounter: Payer: Self-pay | Admitting: Internal Medicine

## 2015-06-28 ENCOUNTER — Ambulatory Visit (INDEPENDENT_AMBULATORY_CARE_PROVIDER_SITE_OTHER): Payer: BC Managed Care – PPO

## 2015-06-28 ENCOUNTER — Encounter: Payer: Self-pay | Admitting: Podiatry

## 2015-06-28 ENCOUNTER — Ambulatory Visit (INDEPENDENT_AMBULATORY_CARE_PROVIDER_SITE_OTHER): Payer: BC Managed Care – PPO | Admitting: Podiatry

## 2015-06-28 VITALS — BP 122/68 | HR 71 | Resp 16

## 2015-06-28 DIAGNOSIS — M76829 Posterior tibial tendinitis, unspecified leg: Secondary | ICD-10-CM

## 2015-06-28 DIAGNOSIS — M25571 Pain in right ankle and joints of right foot: Secondary | ICD-10-CM

## 2015-06-28 DIAGNOSIS — M6789 Other specified disorders of synovium and tendon, multiple sites: Secondary | ICD-10-CM

## 2015-06-28 DIAGNOSIS — M2141 Flat foot [pes planus] (acquired), right foot: Secondary | ICD-10-CM

## 2015-06-28 DIAGNOSIS — M66871 Spontaneous rupture of other tendons, right ankle and foot: Secondary | ICD-10-CM | POA: Diagnosis not present

## 2015-06-28 MED ORDER — METHYLPREDNISOLONE 4 MG PO TBPK: ORAL_TABLET | ORAL | Status: DC

## 2015-06-28 NOTE — Progress Notes (Signed)
She presents today for follow-up of her right ankle. She states that it really bothers her walking up and down stairs and if she has added any weight to her body such as carrying books or boxes. She states that is very sore right here and she points to the medial aspect of the right ankle. She denies any trauma. We have performed surgery on her previously consisting of a fifth metatarsal osteotomy and an Keller arthroplasty. She tolerated those procedures well and healed uneventfully.   Objective: vital signs are stable she is alert and oriented 3. Pulses are strongly palpable. Neurologic sensorium  Is intact. Muscle strength is normal with exception inversion against resistance utilizing the posterior tibial tendon. She has tenderness in this area. She has pain on palpation of the posterior tibial tendon where I can actually feel nodules within the tendon sheath itself she has areas of fluctuance interspersed with nodularity within the tendon. She has flattening of her medial longitudinal arch which is new. She has tenderness in her medial longitudinal arch. Radiographs demonstrate no ankle deformity but soft tissue swelling of the soft tissues medial arch and medial ankle.   Assessment: posterior tibial tendon dysfunction and I cannot rule out a tear of the posterior tibial tendon at this point. Collapse of the medial longitudinal arch.  Plan: I'm requesting an MRI of the right ankle and foot. In the meantime I have requested that she wear her boot from her previous surgery. I also wrote a prescription for Medrol Dosepak to help alleviate her symptoms.

## 2015-06-29 ENCOUNTER — Telehealth: Payer: Self-pay

## 2015-06-29 NOTE — Telephone Encounter (Signed)
Faxed orders for MRI right ankle without contrast to [place], location at [facility] is no longer open. MRI to assess achilles tendon and rule out tear of PTT

## 2015-07-04 ENCOUNTER — Telehealth: Payer: Self-pay

## 2015-07-04 NOTE — Telephone Encounter (Signed)
Prior Auth submitted via fax to [email or web address] for MRI right ankle w/o contrast order #[id number], order faxed to [place], [place] confirmation for prior auth

## 2015-07-16 ENCOUNTER — Ambulatory Visit (INDEPENDENT_AMBULATORY_CARE_PROVIDER_SITE_OTHER): Payer: BC Managed Care – PPO | Admitting: Internal Medicine

## 2015-07-16 ENCOUNTER — Encounter: Payer: Self-pay | Admitting: Internal Medicine

## 2015-07-16 VITALS — BP 142/76 | HR 80 | Ht 65.75 in | Wt 188.1 lb

## 2015-07-16 DIAGNOSIS — R195 Other fecal abnormalities: Secondary | ICD-10-CM | POA: Diagnosis not present

## 2015-07-16 NOTE — Patient Instructions (Signed)

## 2015-07-16 NOTE — Progress Notes (Signed)
  Referred by Dr. Domenick Gong Subjective:    Patient ID: Elizabeth Hale, female    DOB: 1945-12-02, 69 y.o.   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 Lorelle Formosa back. Annual iFOBT test + - colonoscopy 09/2010 was negative She said the day of test she saw slight amount of red blood on the stool She did have irregular bowel habits loss she was taking an antifungal medication for toenail fungus with irregularity in a lot of bloating and gas and that completely resolved a few weeks after stopping the treatment. That was in the summer. Her mother did develop her was diagnosed with colon cancer at the age of 34 or so. Nobody less than their 60s with colon cancer. No other GI sxs, stool changes and feels okay at this time. Medications, allergies, past medical history, past surgical history, family history and social history are reviewed and updated in the EMR.  Review of Systems She has some joint pains and arthritis otherwise okay negative    Objective:   Physical Exam  @BP  142/76 mmHg  Pulse 80  Ht 5' 5.75" (1.67 m)  Wt 188 lb 2 oz (85.333 kg)  BMI 30.60 kg/m2@  General:  NAD Lungs:  clear Heart:: S1S2 no rubs, murmurs or gallops Abdomen:  soft and nontender, BS+, no organomegaly or mass Neuro/psych: Oriented 3 appropriate mood and affect       Assessment & Plan:  Heme + stool  1. Schedule colonoscopy. This may be anorectal but it'll be 5 years in January since her last colonoscopy thought it was prudent to exclude colorectal neoplasia. Subsequent to that she should not need annual Hemoccults at least for 5 years 2. The risks and benefits as well as alternatives of endoscopic procedure(s) have been discussed and reviewed. All questions answered. The patient agrees to proceed.  I appreciate the opportunity to care for this patient. CC: [name], MD

## 2015-07-17 ENCOUNTER — Ambulatory Visit
Admission: RE | Admit: 2015-07-17 | Discharge: 2015-07-17 | Disposition: A | Discharge status: Home / Self Care | Payer: BC Managed Care – PPO | Source: Ambulatory Visit | Attending: Podiatry | Admitting: Podiatry

## 2015-07-17 DIAGNOSIS — M2141 Flat foot [pes planus] (acquired), right foot: Secondary | ICD-10-CM

## 2015-07-17 DIAGNOSIS — M76829 Posterior tibial tendinitis, unspecified leg: Secondary | ICD-10-CM

## 2015-07-17 DIAGNOSIS — M66871 Spontaneous rupture of other tendons, right ankle and foot: Secondary | ICD-10-CM

## 2015-07-18 ENCOUNTER — Telehealth: Payer: Self-pay | Admitting: *Deleted

## 2015-07-18 NOTE — Telephone Encounter (Addendum)
-----   Message from Garrel Ridgel, [place] sent at 07/18/2015  7:38 AM EST ----- MRI results are in and she has a tear to the posterior tibial tendon.  She needs to be in a boot.  I need to see her in the near future for surgical repair/consult.  Orders to pt, pt states she has a immobilizing boot and will go back into it, and transferred pt to schedulers.

## 2015-07-24 ENCOUNTER — Telehealth: Payer: Self-pay | Admitting: *Deleted

## 2015-07-24 ENCOUNTER — Ambulatory Visit (INDEPENDENT_AMBULATORY_CARE_PROVIDER_SITE_OTHER): Payer: BC Managed Care – PPO | Admitting: Podiatry

## 2015-07-24 ENCOUNTER — Encounter: Payer: Self-pay | Admitting: Podiatry

## 2015-07-24 VITALS — BP 150/69 | HR 88 | Resp 16

## 2015-07-24 DIAGNOSIS — M66871 Spontaneous rupture of other tendons, right ankle and foot: Secondary | ICD-10-CM | POA: Diagnosis not present

## 2015-07-24 DIAGNOSIS — M76829 Posterior tibial tendinitis, unspecified leg: Secondary | ICD-10-CM

## 2015-07-24 DIAGNOSIS — M6789 Other specified disorders of synovium and tendon, multiple sites: Secondary | ICD-10-CM | POA: Diagnosis not present

## 2015-07-24 NOTE — Telephone Encounter (Signed)
I'm calling to see if we can switch your surgery date from 08/17/2015 to 08/16/2015?  "That will be fine."

## 2015-07-24 NOTE — Progress Notes (Signed)
She presents today for follow-up of her MRI right foot. She states left foot is not doing any better and she presents with an antalgic gait.  Objective: Vital signs are stable she is alert and oriented 3 pulses are palpable. Antalgic gait upon ambulation. Pulses are strong bilateral she has pain on palpation posterior tibial tendon right foot with pes planus. MRI states that she has a partial thickness tear.  Assessment: Tibial tendon tear right foot.  Plan: Discussed etiology pathology conservative versus surgical therapies. We discussed surgical correction today and repair this posterior tibial tendon I suggested we perform a posterior tibial tendon repair possible Kidner procedure. With cast I did also express that we very well may have to perform a flexor hallucis longus tendon transfer. She will be in a cast afterwards. This would put her out of work for at least 6 weeks. We went over the consent form line by line number by number giving her ample time to ask questions and she saw fit regarding this procedure answered to the best of my ability in layman's terms. She understood it was amenable to it. We did discuss the possible postop complications which may include but are not limited to stop any bleeding swelling infection recurrence need for further surgery failure of surgery to correct her deformity loss of digit loss of limb loss of life. I will follow-up with her in the near future for correction.  Roselind Messier DPM

## 2015-07-24 NOTE — Patient Instructions (Signed)
Pre-Operative Instructions  Congratulations, you have decided to take an important step to improving your quality of life.  You can be assured that the doctors of [facility] will be with you every step of the way.  1. Plan to be at the surgery center/hospital at least 1 (one) hour prior to your scheduled time unless otherwise directed by the surgical center/hospital staff.  You must have a responsible adult accompany you, remain during the surgery and drive you home.  Make sure you have directions to the surgical center/hospital and know how to get there on time. 2. For hospital based surgery you will need to obtain a history and physical form from your family physician within 1 month prior to the date of surgery- we will give you a form for you primary physician.  3. We make every effort to accommodate the date you request for surgery.  There are however, times where surgery dates or times have to be moved.  We will contact you as soon as possible if a change in schedule is required.   4. No Aspirin/Ibuprofen for one week before surgery.  If you are on aspirin, any non-steroidal anti-inflammatory medications (Mobic, Aleve, Ibuprofen) you should stop taking it 7 days prior to your surgery.  You make take Tylenol  For pain prior to surgery.  5. Medications- If you are taking daily heart and blood pressure medications, seizure, reflux, allergy, asthma, anxiety, pain or diabetes medications, make sure the surgery center/hospital is aware before the day of surgery so they may notify you which medications to take or avoid the day of surgery. 6. No food or drink after midnight the night before surgery unless directed otherwise by surgical center/hospital staff. 7. No alcoholic beverages 24 hours prior to surgery.  No smoking 24 hours prior to or 24 hours after surgery. 8. Wear loose pants or shorts- loose enough to fit over bandages, boots, and casts. 9. No slip on shoes, sneakers are best. 10. Bring  your boot with you to the surgery center/hospital.  Also bring crutches or a walker if your physician has prescribed it for you.  If you do not have this equipment, it will be provided for you after surgery. 11. If you have not been contracted by the surgery center/hospital by the day before your surgery, call to confirm the date and time of your surgery. 12. Leave-time from work may vary depending on the type of surgery you have.  Appropriate arrangements should be made prior to surgery with your employer. 13. Prescriptions will be provided immediately following surgery by your doctor.  Have these filled as soon as possible after surgery and take the medication as directed. 14. Remove nail polish on the operative foot. 15. Wash the night before surgery.  The night before surgery wash the foot and leg well with the antibacterial soap provided and water paying special attention to beneath the toenails and in between the toes.  Rinse thoroughly with water and dry well with a towel.  Perform this wash unless told not to do so by your physician.  Enclosed: 1 Ice pack (please put in freezer the night before surgery)   1 Hibiclens skin cleaner   Pre-op Instructions  If you have any questions regarding the instructions, do not hesitate to call our office.  [place]: [address] [place], [place] [address] [phone]  [place]: [address] [place], [place] [address] [phone]  [place]: [address]  [place], [place] [address] [phone]  Dr. Richard   Tuchman DPM, Dr. Norman Regal DPM Dr. Richard Sikora DPM, Dr. M. Todd Hyatt DPM, Dr. Kathryn Egerton DPM 

## 2015-08-01 ENCOUNTER — Telehealth: Payer: Self-pay | Admitting: *Deleted

## 2015-08-01 NOTE — Telephone Encounter (Signed)
"  I'm scheduled for surgery on December 8th.  Dr. Milinda Pointer said he would have me back in 2 week increments from that date but I wasn't given those times."  You are scheduled for 08/23/2015 at 8:30am.  "What will the next time be?"  He'll let you know when you come in for the post operative appointment.  "I have some other questions can you transfer me to the nurse?"  She's on another call, I can transfer you to the nurse voicemail.  "That's fine."

## 2015-08-16 ENCOUNTER — Encounter: Payer: Self-pay | Admitting: Podiatry

## 2015-08-16 ENCOUNTER — Other Ambulatory Visit: Payer: Self-pay | Admitting: Podiatry

## 2015-08-16 DIAGNOSIS — M76821 Posterior tibial tendinitis, right leg: Secondary | ICD-10-CM | POA: Diagnosis not present

## 2015-08-16 MED ORDER — PROMETHAZINE HCL 25 MG PO TABS: 25.0000 mg | ORAL_TABLET | Freq: Three times a day (TID) | ORAL | Status: DC | PRN

## 2015-08-16 MED ORDER — CEPHALEXIN 500 MG PO CAPS: 500.0000 mg | ORAL_CAPSULE | Freq: Three times a day (TID) | ORAL | Status: DC

## 2015-08-16 MED ORDER — OXYCODONE-ACETAMINOPHEN 10-325 MG PO TABS
ORAL_TABLET | ORAL | Status: DC
Start: 2015-08-16 — End: 2017-01-06

## 2015-08-17 ENCOUNTER — Telehealth: Payer: Self-pay | Admitting: *Deleted

## 2015-08-17 NOTE — Telephone Encounter (Signed)
Spoke with pt, she asked what DR. Hyatt recommended for constipation after surgery.  I told her she should take the OTC product she was familiar with, and start it now.

## 2015-08-23 ENCOUNTER — Encounter: Payer: Self-pay | Admitting: Podiatry

## 2015-08-23 ENCOUNTER — Ambulatory Visit (INDEPENDENT_AMBULATORY_CARE_PROVIDER_SITE_OTHER): Payer: BC Managed Care – PPO | Admitting: Podiatry

## 2015-08-23 VITALS — BP 155/79 | HR 84 | Temp 96.6°F | Resp 12

## 2015-08-23 DIAGNOSIS — Z9889 Other specified postprocedural states: Secondary | ICD-10-CM

## 2015-08-23 DIAGNOSIS — M66871 Spontaneous rupture of other tendons, right ankle and foot: Secondary | ICD-10-CM

## 2015-08-26 NOTE — Progress Notes (Signed)
Elizabeth Hale presents today with her husband utilizing a knee scooter right. She denies fever chills nausea vomiting muscle aches and pains. Status post posterior tibial tendon repair right foot with Kidner procedure.  Objective: Vital signs are taken today she remains stable. She is alert and oriented 3. She denies calf pain chest pain shortness of breath. She states that her toes feel fine and normal she is able to move her toes and there is plenty of room in the proximal portion of the cast.  Assessment: One-week status post posterior tibial tendon repair right foot with cast.  Plan: I encouraged her to keep the right lower extremity elevated. I also encouraged her to keep the cast dry and clean. I will follow-up with her in 1 week at which time the cast will be removed and a new cast will be applied.

## 2015-08-30 ENCOUNTER — Encounter: Payer: Self-pay | Admitting: Podiatry

## 2015-08-30 ENCOUNTER — Ambulatory Visit (INDEPENDENT_AMBULATORY_CARE_PROVIDER_SITE_OTHER): Payer: BC Managed Care – PPO | Admitting: Podiatry

## 2015-08-30 DIAGNOSIS — Z9889 Other specified postprocedural states: Secondary | ICD-10-CM

## 2015-08-30 DIAGNOSIS — M66871 Spontaneous rupture of other tendons, right ankle and foot: Secondary | ICD-10-CM

## 2015-09-03 NOTE — Progress Notes (Signed)
She presents today date of surgery 08/16/2015. She is status post posterior tibial tendon repair with cast. She says I think is doing very well. She denies fever chills nausea vomiting muscle aches and pains presents today with her husband and a knee scooter.  Objective: Vital signs are stable she is alert and oriented 3. Cast is intact and clean on the bottom. Cast was removed today as were sutures and staples. No erythema edema saline as drainage or odor. She has good strong pressure against resistance. No signs of infection.  Assessment: Well-healing Kidner and posterior tibial tendon repair with cast right.  Plan: Staples were removed today redress dry sterile compressive dressing and a below-knee fiberglass cast was applied. I will follow-up with her in 2 weeks at which time the cast will be removed any remaining staples will be removed and she will be placed in a Cam Walker. She will call with questions or concerns.

## 2015-09-04 NOTE — Progress Notes (Signed)
DOS Primary repair posterior tibial tendon right foot, possible FHL transfer right, and application below the knee cast.

## 2015-09-18 ENCOUNTER — Ambulatory Visit (INDEPENDENT_AMBULATORY_CARE_PROVIDER_SITE_OTHER): Payer: BC Managed Care – PPO | Admitting: Podiatry

## 2015-09-18 ENCOUNTER — Encounter: Payer: Self-pay | Admitting: Podiatry

## 2015-09-18 VITALS — BP 154/70 | HR 75 | Resp 16

## 2015-09-18 DIAGNOSIS — M66871 Spontaneous rupture of other tendons, right ankle and foot: Secondary | ICD-10-CM

## 2015-09-18 DIAGNOSIS — Z9889 Other specified postprocedural states: Secondary | ICD-10-CM | POA: Diagnosis not present

## 2015-09-18 NOTE — Progress Notes (Signed)
She presents today for a half weeks status post posterior tibial tendon repair right foot with Kidner procedure. She presents in her cast today in a nonweightbearing fashion utilizing her knee scooter. Date of surgery 08/16/2015. She denies fever chills nausea vomiting muscle aches and pains. She denies chest pain and shortness of breath.  Objective: Vital signs stable alert and oriented 3 once the cast was removed demonstrates no erythema no edema no cellulitis drainage or odor. Good range of motion of the ankle joint and subtalar joint. Mild tenderness against inversion with resistance. Suture site is closed and well coapted. Skin is dry and tight.  Assessment: Well-healing posterior tibial tendon repair 4-1/2 weeks postop right foot.  Plan: Cast was removed today I will be placing her in a compression anklet for daytime wear and she will continue to wear her Cam Walker for the next 2 weeks, she is exercising her foot. She is to wear the Cam Walker while in bed as well. I will allow her to stand static but no ambulation. She will notify me with questions or concerns. We'll follow up with her in 2 weeks.

## 2015-09-21 ENCOUNTER — Encounter: Payer: BC Managed Care – PPO | Admitting: Internal Medicine

## 2015-10-02 ENCOUNTER — Encounter: Payer: Self-pay | Admitting: Podiatry

## 2015-10-02 ENCOUNTER — Ambulatory Visit (INDEPENDENT_AMBULATORY_CARE_PROVIDER_SITE_OTHER): Payer: BC Managed Care – PPO | Admitting: Podiatry

## 2015-10-02 VITALS — BP 126/64 | HR 86 | Resp 16

## 2015-10-02 DIAGNOSIS — Z9889 Other specified postprocedural states: Secondary | ICD-10-CM

## 2015-10-02 DIAGNOSIS — M66871 Spontaneous rupture of other tendons, right ankle and foot: Secondary | ICD-10-CM

## 2015-10-02 MED ORDER — CYCLOBENZAPRINE HCL 5 MG PO TABS: 5.0000 mg | ORAL_TABLET | Freq: Three times a day (TID) | ORAL | Status: AC | PRN

## 2015-10-16 ENCOUNTER — Ambulatory Visit (INDEPENDENT_AMBULATORY_CARE_PROVIDER_SITE_OTHER): Payer: BC Managed Care – PPO | Admitting: Podiatry

## 2015-10-16 ENCOUNTER — Encounter: Payer: Self-pay | Admitting: Podiatry

## 2015-10-16 VITALS — BP 168/86 | HR 96 | Resp 16

## 2015-10-16 DIAGNOSIS — M66871 Spontaneous rupture of other tendons, right ankle and foot: Secondary | ICD-10-CM | POA: Diagnosis not present

## 2015-10-16 DIAGNOSIS — Z9889 Other specified postprocedural states: Secondary | ICD-10-CM | POA: Diagnosis not present

## 2015-10-16 NOTE — Progress Notes (Signed)
Date of surgery 08/16/2015 repair the posterior tibial tendon right foot. She states that she is doing great has no pain.  Objective: Vital signs are stable she is alert and oriented 3. Pulses are palpable. She has great range of motion with inversion and eversion against resistance. No pain moderate edema no cellulitis or drainage or odor.  Assessment: Well-healing surgical foot right.  Plan: Place her in a Tri-Lock brace today and would allow her to get back into her regular shoe gear and I will follow-up with her in 6 weeks.

## 2015-11-21 ENCOUNTER — Encounter: Payer: Self-pay | Admitting: Internal Medicine

## 2015-11-27 ENCOUNTER — Ambulatory Visit (INDEPENDENT_AMBULATORY_CARE_PROVIDER_SITE_OTHER): Payer: BC Managed Care – PPO | Admitting: Podiatry

## 2015-11-27 ENCOUNTER — Encounter: Payer: Self-pay | Admitting: Podiatry

## 2015-11-27 VITALS — BP 126/88 | HR 69 | Resp 12

## 2015-11-27 DIAGNOSIS — Z9889 Other specified postprocedural states: Secondary | ICD-10-CM | POA: Diagnosis not present

## 2015-11-27 DIAGNOSIS — M66871 Spontaneous rupture of other tendons, right ankle and foot: Secondary | ICD-10-CM | POA: Diagnosis not present

## 2015-11-27 NOTE — Progress Notes (Signed)
She presents today for her final postop visit regarding bunion repair right foot and posterior tibial tendon repair. She states that she is doing fine she says she has no pain she's ready to be released.  Objective: Vital signs stable alert and oriented 3 she has great range of motion of the first metatarsophalangeal joint and of the subtalar joint and inversion against resistance is nonpainful.  Assessment: Well-healing surgical foot.  Plan: Encouraged her to continue the angle stabilizer and follow-up with me on an as-needed basis. She should continue her efforts to wean off of the stabilizer.

## 2015-12-04 ENCOUNTER — Ambulatory Visit (AMBULATORY_SURGERY_CENTER): Payer: Self-pay

## 2015-12-04 VITALS — Ht 67.0 in | Wt 190.2 lb

## 2015-12-04 DIAGNOSIS — R195 Other fecal abnormalities: Secondary | ICD-10-CM

## 2015-12-17 ENCOUNTER — Encounter: Payer: Self-pay | Admitting: Internal Medicine

## 2015-12-17 ENCOUNTER — Ambulatory Visit (AMBULATORY_SURGERY_CENTER): Payer: BC Managed Care – PPO | Admitting: Internal Medicine

## 2015-12-17 VITALS — BP 132/76 | HR 63 | Temp 98.6°F | Resp 14 | Ht 67.0 in | Wt 190.0 lb

## 2015-12-17 DIAGNOSIS — R195 Other fecal abnormalities: Secondary | ICD-10-CM | POA: Diagnosis not present

## 2015-12-17 DIAGNOSIS — K626 Ulcer of anus and rectum: Secondary | ICD-10-CM | POA: Diagnosis not present

## 2015-12-17 DIAGNOSIS — D123 Benign neoplasm of transverse colon: Secondary | ICD-10-CM | POA: Diagnosis not present

## 2015-12-17 MED ORDER — SODIUM CHLORIDE 0.9 % IV SOLN: 500.0000 mL | INTRAVENOUS | Status: DC

## 2015-12-17 NOTE — Progress Notes (Signed)
Called to room to assist during endoscopic procedure.  Patient ID and intended procedure confirmed with present staff. Received instructions for my participation in the procedure from the performing physician.  

## 2015-12-17 NOTE — Progress Notes (Signed)
Report to PACU, RN, vss, BBS= Clear.  

## 2015-12-17 NOTE — Patient Instructions (Addendum)
There were some tiny ulcers in one area of the colon. It was probably from the preparation but I took biopsies to see if there was colitis. No polyps and no cancer seen.  I will let you know pathology results and when/if to have another routine colonoscopy by mail. I would not do stool tests for blood anymore.  I appreciate the opportunity to care for you. Gatha Mayer, MD, FACG  YOU HAD AN ENDOSCOPIC PROCEDURE TODAY AT [facility]:   Refer to the procedure report that was given to you for any specific questions about what was found during the examination.  If the procedure report does not answer your questions, please call your gastroenterologist to clarify.  If you requested that your care partner not be given the details of your procedure findings, then the procedure report has been included in a sealed envelope for you to review at your convenience later.  YOU SHOULD EXPECT: Some feelings of bloating in the abdomen. Passage of more gas than usual.  Walking can help get rid of the air that was put into your GI tract during the procedure and reduce the bloating. If you had a lower endoscopy (such as a colonoscopy or flexible sigmoidoscopy) you may notice spotting of blood in your stool or on the toilet paper. If you underwent a bowel prep for your procedure, you may not have a normal bowel movement for a few days.  Please Note:  You might notice some irritation and congestion in your nose or some drainage.  This is from the oxygen used during your procedure.  There is no need for concern and it should clear up in a day or so.  SYMPTOMS TO REPORT IMMEDIATELY:   Following lower endoscopy (colonoscopy or flexible sigmoidoscopy):  Excessive amounts of blood in the stool  Significant tenderness or worsening of abdominal pains  Swelling of the abdomen that is new, acute  Fever of 100F or higher   For urgent or emergent issues, a gastroenterologist can be reached at  any hour by calling [phone].   DIET: Your first meal following the procedure should be a small meal and then it is ok to progress to your normal diet. Heavy or fried foods are harder to digest and may make you feel nauseous or bloated.  Likewise, meals heavy in dairy and vegetables can increase bloating.  Drink plenty of fluids but you should avoid alcoholic beverages for 24 hours.  ACTIVITY:  You should plan to take it easy for the rest of today and you should NOT DRIVE or use heavy machinery until tomorrow (because of the sedation medicines used during the test).    FOLLOW UP: Our staff will call the number listed on your records the next business day following your procedure to check on you and address any questions or concerns that you may have regarding the information given to you following your procedure. If we do not reach you, we will leave a message.  However, if you are feeling well and you are not experiencing any problems, there is no need to return our call.  We will assume that you have returned to your regular daily activities without incident.  If any biopsies were taken you will be contacted by phone or by letter within the next 1-3 weeks.  Please call us at [phone] if you have not heard about the biopsies in 3 weeks.    SIGNATURES/CONFIDENTIALITY: You and/or your care partner have  signed paperwork which will be entered into your electronic medical record.  These signatures attest to the fact that that the information above on your After Visit Summary has been reviewed and is understood.  Full responsibility of the confidentiality of this discharge information lies with you and/or your care-partner.

## 2015-12-17 NOTE — Op Note (Signed)
[place] Patient Name: Elizabeth Hale Procedure Date: 12/17/2015 11:47 AM MRN: [id number] Endoscopist: Gatha Mayer , MD Age: 70 Date of Birth: [birth year] Gender: Female Procedure:                Colonoscopy Indications:              Evaluation of unexplained GI bleeding Medicines:                Propofol per Anesthesia, Monitored Anesthesia Care Procedure:                Pre-Anesthesia Assessment:                           - Prior to the procedure, a History and Physical                            was performed, and patient medications and                            allergies were reviewed. The patient's tolerance of                            previous anesthesia was also reviewed. The risks                            and benefits of the procedure and the sedation                            options and risks were discussed with the patient.                            All questions were answered, and informed consent                            was obtained. Prior Anticoagulants: The patient has                            taken no previous anticoagulant or antiplatelet                            agents. ASA Grade Assessment: II - A patient with                            mild systemic disease. After reviewing the risks                            and benefits, the patient was deemed in                            satisfactory condition to undergo the procedure.                           After obtaining informed consent, the colonoscope  was passed under direct vision. Throughout the                            procedure, the patient's blood pressure, pulse, and                            oxygen saturations were monitored continuously. The                            Model PCF-H190DL [id number]) scope was introduced                            through the anus and advanced to the the terminal                            ileum, with identification of the  appendiceal                            orifice and IC valve. The colonoscopy was performed                            without difficulty. The patient tolerated the                            procedure well. The quality of the bowel                            preparation was excellent. The bowel preparation                            used was Miralax. The terminal ileum, ileocecal                            valve, appendiceal orifice, and rectum were                            photographed. Scope In: 12:00:13 PM Scope Out: 12:12:08 PM Scope Withdrawal Time: 0 hours 8 minutes 45 seconds  Total Procedure Duration: 0 hours 11 minutes 55 seconds  Findings:                 The perianal and digital rectal examinations were                            normal.                           A few ulcers were found at the splenic flexure. No                            bleeding was present. Stigmata of recent bleeding                            were present. Biopsies were taken with a cold  forceps for histology. Verification of patient                            identification for the specimen was done. Estimated                            blood loss was minimal.                           The exam was otherwise without abnormality on                            direct and retroflexion views.                           The terminal ileum appeared normal. Complications:            No immediate complications. Estimated Blood Loss:     Estimated blood loss was minimal. Impression:               - A few ulcers at the splenic flexure. Biopsied.                           - The examination was otherwise normal on direct                            and retroflexion views. Recommendation:           - Patient has a contact number available for                            emergencies. The signs and symptoms of potential                            delayed complications were discussed with the                             patient. Return to normal activities tomorrow.                            Written discharge instructions were provided to the                            patient.                           - Resume previous diet.                           - Continue present medications.                           - Await pathology results.                           - May not need any more routine colon cancer  screening given 2 colonoscopy exams and no polyps.                           - No recommendation at this time regarding repeat                            colonoscopy. Gatha Mayer, MD 12/17/2015 12:19:39 PM This report has been signed electronically. CC Letter to:             [name], MD

## 2015-12-18 ENCOUNTER — Telehealth: Payer: Self-pay

## 2015-12-18 NOTE — Telephone Encounter (Signed)
  Follow up Call-  Call back number 12/17/2015  Post procedure Call Back phone  # [phone]  Permission to leave phone message Yes     Patient questions:  Do you have a fever, pain , or abdominal swelling? Yes.   Pain Score  0 *  Have you tolerated food without any problems? Yes.    Have you been able to return to your normal activities? Yes.    Do you have any questions about your discharge instructions: Diet   No. Medications  No. Follow up visit  No.  Do you have questions or concerns about your Care? No.  Actions: * If pain score is 4 or above: No action needed, pain <4.   No problems per the pt. maw

## 2015-12-25 NOTE — Progress Notes (Signed)
Quick Note:  Ulcer results - below - not of concern My Chart message to patient No recall No letter plesae be sure to cc her PCP Diagnosis Surgical [P], splenic flexure ulcerations -BENIGN POLYPOID COLONIC MUCOSA WITH LYMPHOID AGGREGATE. -NO ADENOMATOUS CHANGE OR MALIGNANCY IDENTIFIED.   ______

## 2016-01-08 NOTE — Progress Notes (Signed)
She presents today for follow-up US from surgery to her left foot. She had a posterior tibial tendon repair with transfer of tendon. She presents stating that her right leg is painful from all the cramping she is having.  Objective: Vital signs are stable she is alert and oriented 3. Pulses are palpable. Cast removed left leg demonstrates well-healing surgical site. Good range of motion right leg demonstrate no pain on palpation palpable pulses bilateral. No signs of infection.  Assessment: Compensatory pain to the right leg left leg appears to be healing well.  Plan: Continue current therapies as far as the left leg we will prescribe a muscle relaxer for the right leg follow-up with her in 1 month.

## 2016-12-02 ENCOUNTER — Encounter: Payer: Self-pay | Admitting: Podiatry

## 2016-12-02 ENCOUNTER — Ambulatory Visit (INDEPENDENT_AMBULATORY_CARE_PROVIDER_SITE_OTHER): Payer: BC Managed Care – PPO | Admitting: Podiatry

## 2016-12-02 DIAGNOSIS — L603 Nail dystrophy: Secondary | ICD-10-CM | POA: Diagnosis not present

## 2016-12-03 NOTE — Progress Notes (Signed)
She presents today to discuss options regarding her toenails.  Objective: Vital signs are stable she is alert and oriented 3. Pulses are palpable. Her toenails are thick yellow dystrophic. I reviewed the report taken 2 years ago regarding a pathological sample of the toenails. He did state that she had not only yeast but upset arthritic mold as well. She took oral medication at that time with no resolve. It also stated that the nails were dystrophic with a total dystrophic pattern of growth.  Assessment: Onychodystrophy.  Plan: We discussed laser therapy versus nail avulsion with matrixectomy today she's going to talk to her husband about this. She will get back to me.

## 2017-01-06 ENCOUNTER — Ambulatory Visit (INDEPENDENT_AMBULATORY_CARE_PROVIDER_SITE_OTHER): Payer: BC Managed Care – PPO | Admitting: Podiatry

## 2017-01-06 ENCOUNTER — Encounter: Payer: Self-pay | Admitting: Podiatry

## 2017-01-06 VITALS — BP 137/66 | HR 90 | Resp 16

## 2017-01-06 DIAGNOSIS — L600 Ingrowing nail: Secondary | ICD-10-CM

## 2017-01-06 MED ORDER — NEOMYCIN-POLYMYXIN-HC 1 % OT SOLN: OTIC | 1 refills | Status: AC

## 2017-01-06 NOTE — Progress Notes (Signed)
She presented today for planned nail avulsion with matrixectomy hallux bilateral.  Objective: Vital signs are stable she is alert and oriented 3 pulses range strongly palpable. Capillary fill time is immediate. 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  Assessment: Pain limb secondary to onychodystrophy and ingrown toenails hallux bilateral.  Plan: Chemical matrixectomy's were performed today after local anesthesia was administered to the hallux bilaterally. She tolerated this procedure well without complications. She will receive both ulnar and ongoing instruction for care and soaking of her toes as well as a prescription for Cortisporin Otic to be applied twice daily after soaking. I will follow-up with her in 2 weeks.

## 2017-01-06 NOTE — Patient Instructions (Signed)

## 2017-01-20 ENCOUNTER — Encounter: Payer: Self-pay | Admitting: Podiatry

## 2017-01-20 ENCOUNTER — Ambulatory Visit (INDEPENDENT_AMBULATORY_CARE_PROVIDER_SITE_OTHER): Payer: BC Managed Care – PPO | Admitting: Podiatry

## 2017-01-20 DIAGNOSIS — L600 Ingrowing nail: Secondary | ICD-10-CM

## 2017-01-20 NOTE — Patient Instructions (Signed)

## 2017-01-21 NOTE — Progress Notes (Signed)
She presents today for follow-up of her matrixectomy hallux bilateral. She states that I think they're doing good.  She states that she starting get some itching and she is soaking daily.  Objective: Vital signs are stable she is alert and oriented 3 no erythema edema cellulitis drainage or odor granulation tissue appears to be epithelializing over the nailbeds. I see no signs of infection.  Assessment: Well-healing surgical toes hallux bilateral.  Plan: Discontinue Betadine so with Epsom salts and warm water soaks covered in the daily open at night.

## 2017-02-25 IMAGING — MR MR ANKLE*R* W/O CM
4 of 5 series · 15 of 40 positions shown · non-contrast
Comparison: 06/28/2015

CLINICAL DATA: Medial ankle pain for 3 months.

EXAM:
MRI OF THE RIGHT ANKLE WITHOUT CONTRAST
TECHNIQUE: Multiplanar, multisequence MR imaging of the ankle was performed. No
intravenous contrast was administered.

[Series 4: PD fat-sat · axial · 3.5mm · 0.25mm/px · z∈[-97,-4]mm · 6 of 28 slices shown]
[im 1/28]
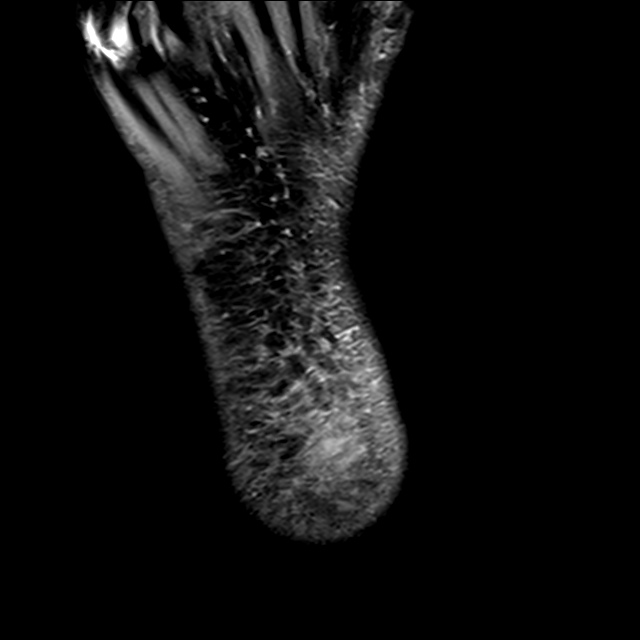
[im 4/28]
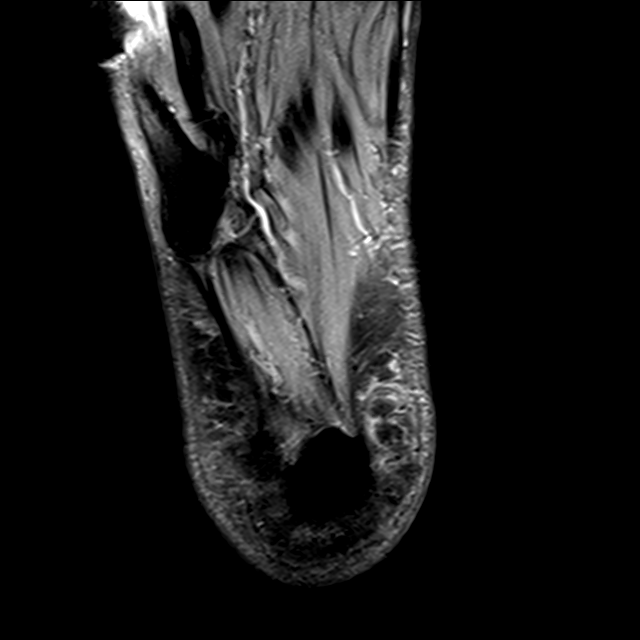
[im 7/28]
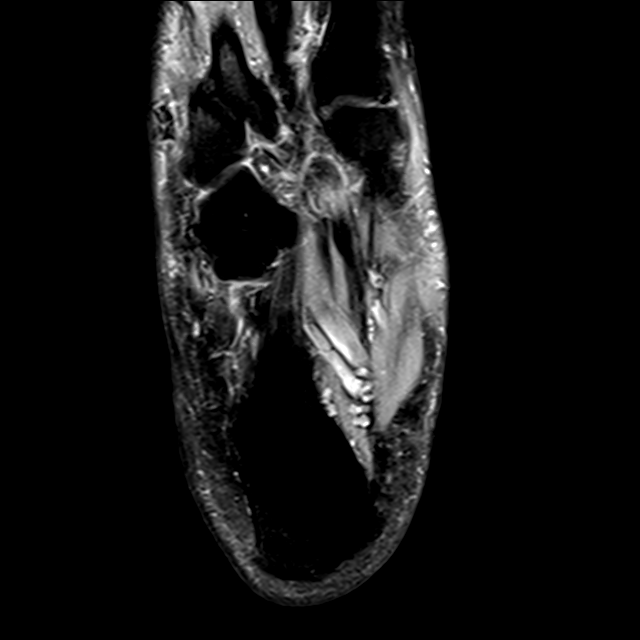
[im 11/28]
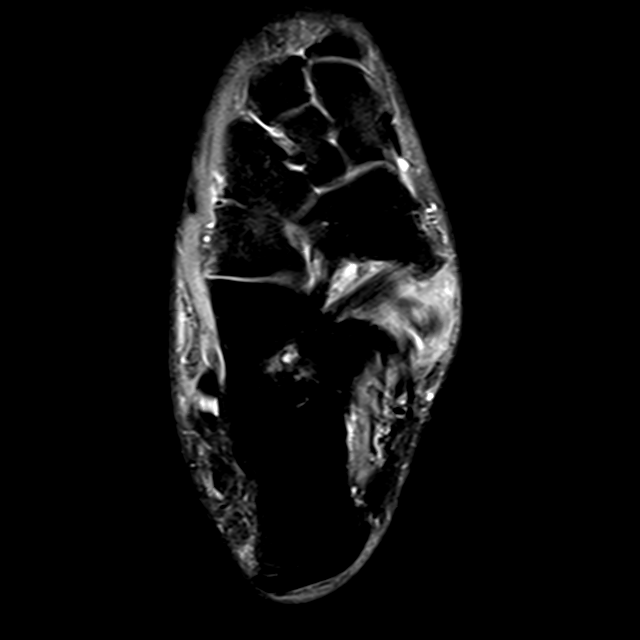
[im 14/28]
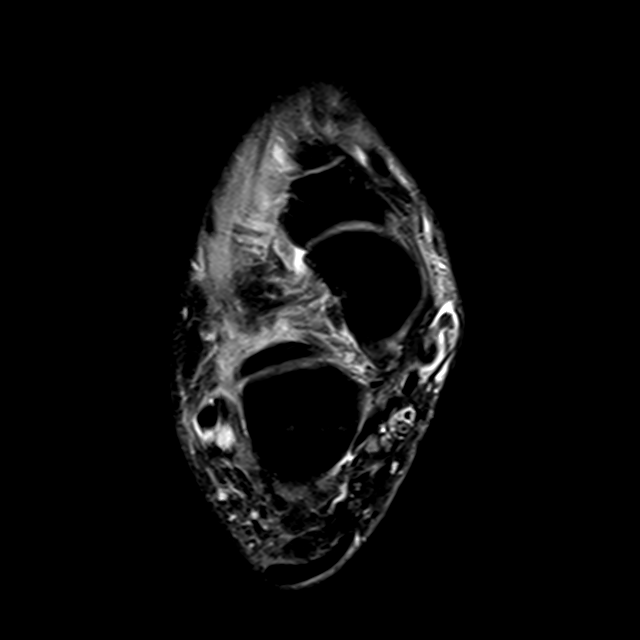
[im 24/28]
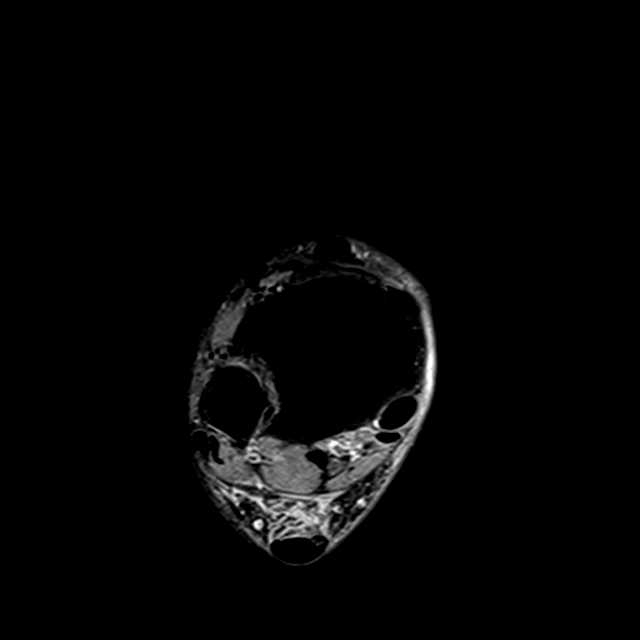

[Series 5: T2 fat-sat · axial · 3.5mm · 0.28mm/px · z∈[-85,-4]mm · 3 of 28 slices shown (1 of 3)]
[im 4/28]
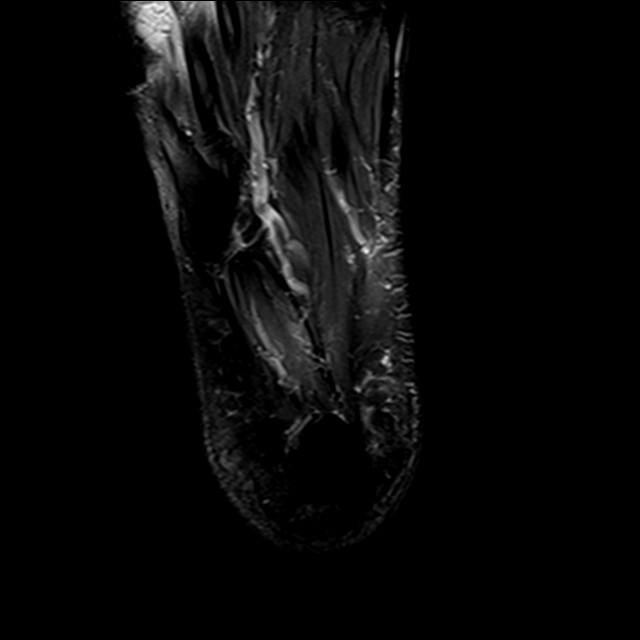
[im 14/28]
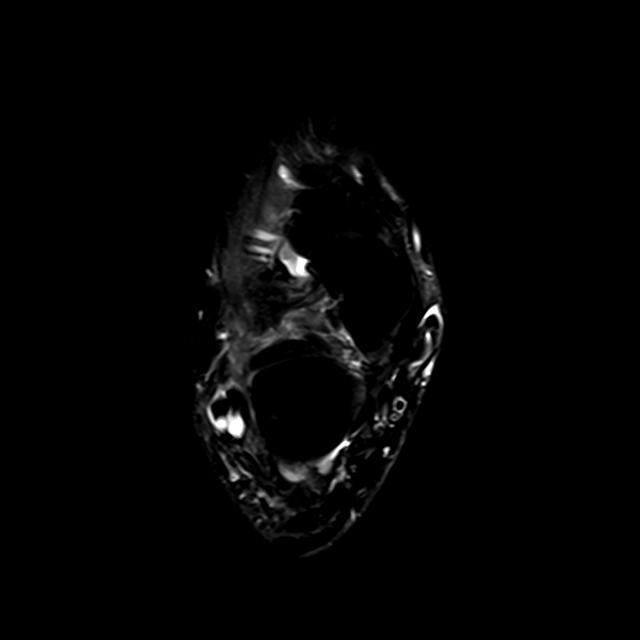
[im 24/28]
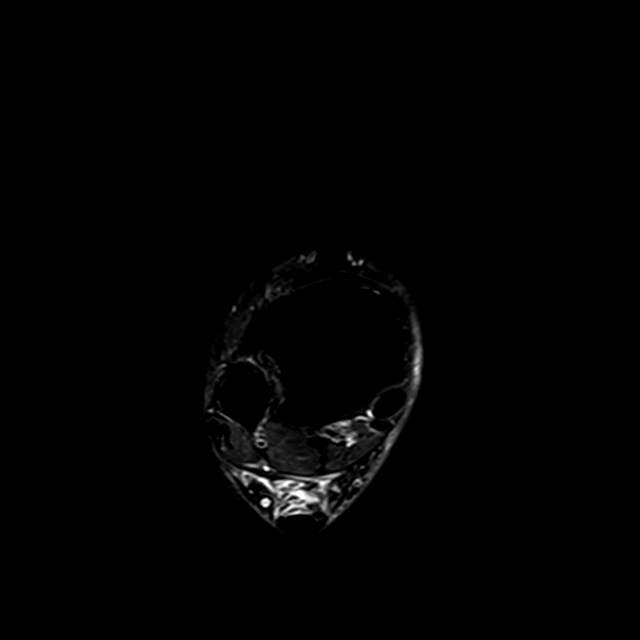

[Series 7: T2 fat-sat · coronal · 3.5mm · 0.35mm/px · 3 of 30 slices shown (2 of 3)]
[im 4/30]
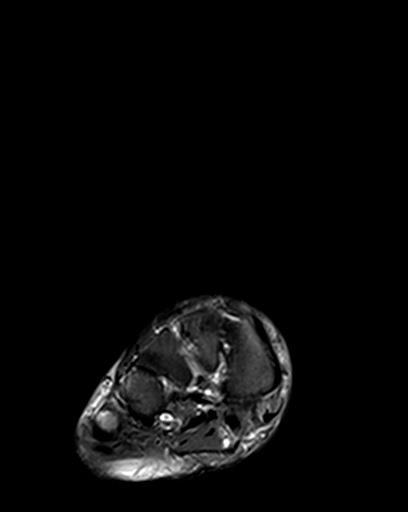
[im 17/30]
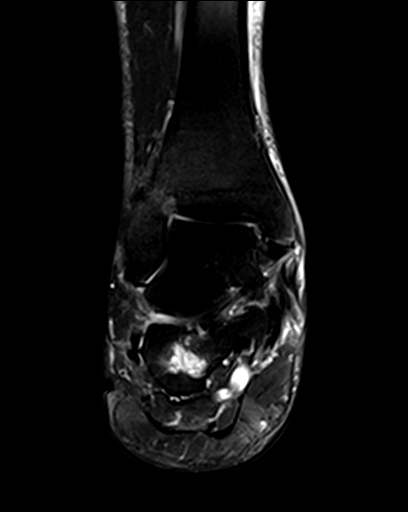
[im 26/30]
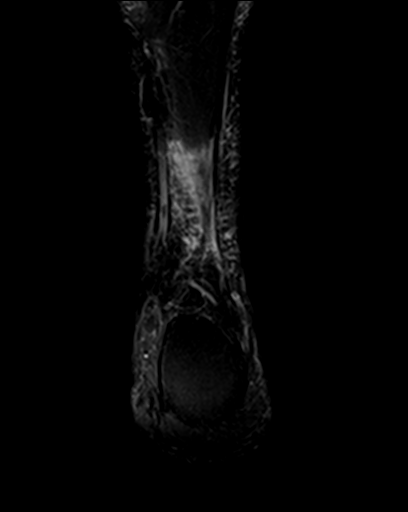

[Series 8: T2 fat-sat · sagittal · 3.0mm · 0.33mm/px · 3 of 20 slices shown (3 of 3)]
[im 4/20]
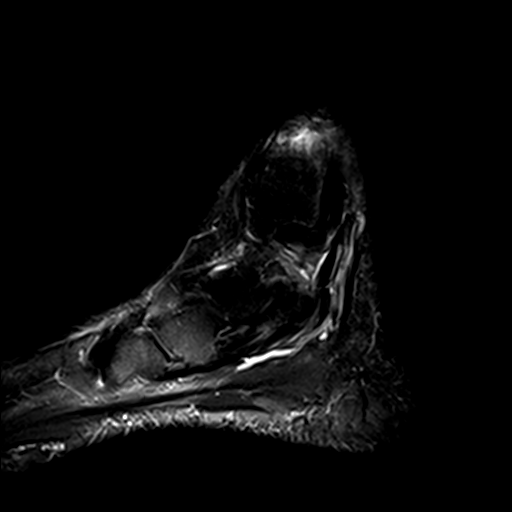
[im 12/20]
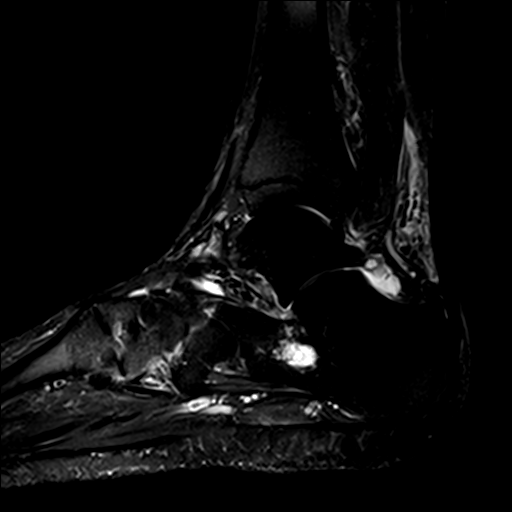
[im 20/20]
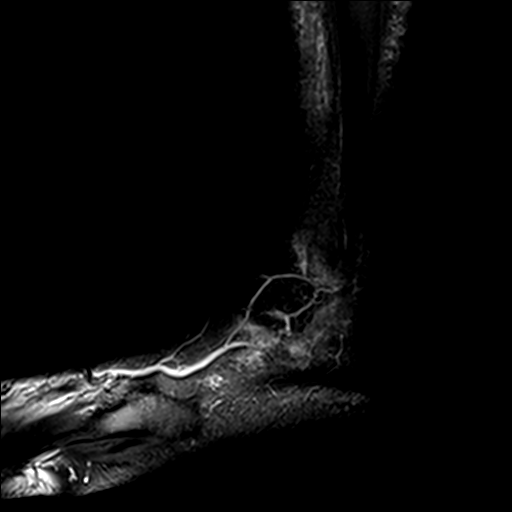

[15 of 40 positions shown; findings below may reference images not displayed]

FINDINGS: TENDONS

Peroneal: Common peroneus tendon sheath tenosynovitis.

Posteromedial: Greatly expanded tibialis posterior tendon posterior
to the medial malleolus is torn below the level of the medial
malleolus but not totally detached. There is a flap of the tendon
extending medially within the fluid-filled tendon sheath, and the
continuous portion of the tendon is expanded with abnormal increased
signal as it approaches the navicular compatible with severe distal
tendinopathy.

Anterior: Unremarkable

Achilles: Unremarkable, although there is edema in Kager's fat pad.

Plantar Fascia: Unremarkable

LIGAMENTS

Lateral: Unremarkable

Medial: Thickened tibiospring and tibionavicular components of the
deltoid ligament. Thickened superomedial portion of the spring
ligament. Larger than normal recess between the medioplantar oblique
and inferoplantar longitudinal components of the spring ligament
suspicious for tear. Lisfranc ligament intact.

CARTILAGE

Ankle Joint: Unremarkable

Subtalar Joints/Sinus Tarsi: Mild degenerative subcortical edema
along the calcaneal side of the middle subtalar facet, image 16
series 8

Bones: Vascular remnant in the calcaneal neck. Mild dorsal midfoot
spurring. Postoperative findings distally in the first and fifth
metatarsals.
IMPRESSION: 1. Torn but not totally ruptured tibialis posterior tendon with
tenosynovitis and severe distal tendinopathy of the portion of the
tendon that is not discontinuous. Abnormal thickening of the
adjacent superomedial component of the Arissa Billiot ligament and likely
tear of the inferior components of the spring ligament.
2. Common peroneus tendon sheath tenosynovitis.
3. Thickened tibiospring and tibionavicular components of the
deltoid ligament, potentially from sprain.
4. Subtle arthropathy along the middle subtalar facet.
5. Mild dorsal midfoot spurring.

## 2017-12-07 ENCOUNTER — Encounter: Payer: Self-pay | Admitting: Internal Medicine
